# Patient Record
Sex: Female | Born: 1965 | Race: Black or African American | Hispanic: No | Marital: Single | Smoking: Current every day smoker
Health system: Southern US, Community
[De-identification: ages and names within clinical notes are randomized; demographics above are authoritative.]

## PROBLEM LIST (undated history)

## (undated) DIAGNOSIS — F32A Depression, unspecified: Secondary | ICD-10-CM

## (undated) DIAGNOSIS — E785 Hyperlipidemia, unspecified: Secondary | ICD-10-CM

## (undated) DIAGNOSIS — I639 Cerebral infarction, unspecified: Secondary | ICD-10-CM

## (undated) DIAGNOSIS — I10 Essential (primary) hypertension: Secondary | ICD-10-CM

## (undated) DIAGNOSIS — D509 Iron deficiency anemia, unspecified: Secondary | ICD-10-CM

## (undated) DIAGNOSIS — E119 Type 2 diabetes mellitus without complications: Secondary | ICD-10-CM

## (undated) DIAGNOSIS — Z720 Tobacco use: Secondary | ICD-10-CM

## (undated) DIAGNOSIS — K219 Gastro-esophageal reflux disease without esophagitis: Secondary | ICD-10-CM

---

## 2003-12-06 ENCOUNTER — Other Ambulatory Visit: Payer: Self-pay

## 2004-02-01 ENCOUNTER — Other Ambulatory Visit: Payer: Self-pay

## 2006-08-31 ENCOUNTER — Emergency Department: Payer: Self-pay | Admitting: Unknown Physician Specialty

## 2006-08-31 ENCOUNTER — Other Ambulatory Visit: Payer: Self-pay

## 2007-07-01 ENCOUNTER — Emergency Department: Payer: Self-pay | Admitting: Emergency Medicine

## 2007-07-01 ENCOUNTER — Other Ambulatory Visit: Payer: Self-pay

## 2007-09-02 ENCOUNTER — Other Ambulatory Visit: Payer: Self-pay

## 2007-09-02 ENCOUNTER — Emergency Department: Payer: Self-pay | Admitting: Emergency Medicine

## 2009-01-11 ENCOUNTER — Emergency Department: Payer: Self-pay | Admitting: Emergency Medicine

## 2010-04-06 ENCOUNTER — Emergency Department: Payer: Self-pay | Admitting: Internal Medicine

## 2010-05-04 ENCOUNTER — Emergency Department: Payer: Self-pay | Admitting: Emergency Medicine

## 2010-05-05 ENCOUNTER — Emergency Department: Payer: Self-pay | Admitting: Emergency Medicine

## 2011-06-06 ENCOUNTER — Emergency Department: Payer: Self-pay | Admitting: *Deleted

## 2012-02-16 ENCOUNTER — Emergency Department: Payer: Self-pay | Admitting: Emergency Medicine

## 2012-02-16 LAB — COMPREHENSIVE METABOLIC PANEL
Albumin: 3.8 g/dL (ref 3.4–5.0)
Alkaline Phosphatase: 77 U/L (ref 50–136)
Anion Gap: 7 (ref 7–16)
BUN: 10 mg/dL (ref 7–18)
Co2: 25 mmol/L (ref 21–32)
EGFR (Non-African Amer.): >60
Glucose: 115 mg/dL — ABNORMAL HIGH (ref 65–99)
Potassium: 3.8 mmol/L (ref 3.5–5.1)
SGOT(AST): 13 U/L — ABNORMAL LOW (ref 15–37)
SGPT (ALT): 14 U/L
Total Protein: 7.7 g/dL (ref 6.4–8.2)

## 2012-02-16 LAB — CBC
HCT: 39.1 % (ref 35.0–47.0)
HGB: 12.4 g/dL (ref 12.0–16.0)
MCV: 85 fL (ref 80–100)
Platelet: 324 10*3/uL (ref 150–440)
RBC: 4.61 10*6/uL (ref 3.80–5.20)
RDW: 15.1 % — ABNORMAL HIGH (ref 11.5–14.5)

## 2012-02-16 LAB — URINALYSIS, COMPLETE
Bacteria: NONE SEEN
Bilirubin,UR: NEGATIVE
Hyaline Cast: 3
Ph: 5 (ref 4.5–8.0)
Protein: 30
RBC,UR: 7 /HPF (ref 0–5)
WBC UR: 1 /HPF (ref 0–5)

## 2012-04-16 ENCOUNTER — Emergency Department: Payer: Self-pay | Admitting: Emergency Medicine

## 2012-04-16 LAB — COMPREHENSIVE METABOLIC PANEL
Albumin: 4.1 g/dL (ref 3.4–5.0)
Alkaline Phosphatase: 88 U/L (ref 50–136)
Anion Gap: 8 (ref 7–16)
BUN: 9 mg/dL (ref 7–18)
Calcium, Total: 10.2 mg/dL — ABNORMAL HIGH (ref 8.5–10.1)
Chloride: 105 mmol/L (ref 98–107)
Co2: 24 mmol/L (ref 21–32)
Creatinine: 0.74 mg/dL (ref 0.60–1.30)
EGFR (Non-African Amer.): >60
Glucose: 186 mg/dL — ABNORMAL HIGH (ref 65–99)
Potassium: 3.8 mmol/L (ref 3.5–5.1)
SGOT(AST): 8 U/L — ABNORMAL LOW (ref 15–37)
Sodium: 137 mmol/L (ref 136–145)
Total Protein: 7.8 g/dL (ref 6.4–8.2)

## 2012-04-16 LAB — CBC WITH DIFFERENTIAL/PLATELET
Basophil %: 0.6 %
Eosinophil #: 0.2 10*3/uL (ref 0.0–0.7)
Lymphocyte %: 28.4 %
MCH: 27.6 pg (ref 26.0–34.0)
MCHC: 33.8 g/dL (ref 32.0–36.0)
MCV: 82 fL (ref 80–100)
Neutrophil #: 5.5 10*3/uL (ref 1.4–6.5)
Neutrophil %: 63.5 %
Platelet: 305 10*3/uL (ref 150–440)
RDW: 13.9 % (ref 11.5–14.5)
WBC: 8.7 10*3/uL (ref 3.6–11.0)

## 2012-04-16 LAB — TROPONIN I: Troponin-I: <0.02 ng/mL

## 2012-10-07 ENCOUNTER — Emergency Department: Payer: Self-pay | Admitting: Emergency Medicine

## 2012-10-07 LAB — CBC
HCT: 34.9 % — ABNORMAL LOW (ref 35.0–47.0)
HGB: 11.6 g/dL — ABNORMAL LOW (ref 12.0–16.0)
MCH: 27.4 pg (ref 26.0–34.0)
MCHC: 33.3 g/dL (ref 32.0–36.0)
Platelet: 272 10*3/uL (ref 150–440)

## 2012-10-07 LAB — URINALYSIS, COMPLETE
Bacteria: NONE SEEN
WBC UR: 262 /HPF (ref 0–5)

## 2012-11-11 ENCOUNTER — Emergency Department: Payer: Self-pay | Admitting: Emergency Medicine

## 2012-11-11 LAB — WET PREP, GENITAL

## 2012-11-11 LAB — URINALYSIS, COMPLETE
Ketone: NEGATIVE
Nitrite: NEGATIVE
Ph: 5 (ref 4.5–8.0)
RBC,UR: 6 /HPF (ref 0–5)
Specific Gravity: 1.015 (ref 1.003–1.030)
Squamous Epithelial: 2

## 2012-12-03 ENCOUNTER — Emergency Department: Payer: Self-pay | Admitting: Emergency Medicine

## 2012-12-03 LAB — CBC
HCT: 36.6 % (ref 35.0–47.0)
HGB: 12.2 g/dL (ref 12.0–16.0)
MCHC: 33.4 g/dL (ref 32.0–36.0)
MCV: 85 fL (ref 80–100)
RBC: 4.32 10*6/uL (ref 3.80–5.20)
WBC: 9.9 10*3/uL (ref 3.6–11.0)

## 2012-12-03 LAB — BASIC METABOLIC PANEL
BUN: 7 mg/dL (ref 7–18)
Calcium, Total: 10 mg/dL (ref 8.5–10.1)
Chloride: 105 mmol/L (ref 98–107)
Co2: 27 mmol/L (ref 21–32)
Creatinine: 0.57 mg/dL — ABNORMAL LOW (ref 0.60–1.30)
EGFR (African American): >60
Glucose: 172 mg/dL — ABNORMAL HIGH (ref 65–99)
Osmolality: 278 (ref 275–301)

## 2012-12-03 LAB — CK TOTAL AND CKMB (NOT AT ARMC): CK, Total: 66 U/L (ref 21–215)

## 2012-12-03 LAB — TROPONIN I: Troponin-I: <0.02 ng/mL

## 2012-12-04 LAB — URINALYSIS, COMPLETE: Squamous Epithelial: NONE SEEN

## 2013-03-28 ENCOUNTER — Emergency Department: Payer: Self-pay | Admitting: Emergency Medicine

## 2013-12-15 ENCOUNTER — Emergency Department: Payer: Self-pay | Admitting: Emergency Medicine

## 2014-01-02 ENCOUNTER — Emergency Department: Payer: Self-pay | Admitting: Emergency Medicine

## 2014-01-02 LAB — COMPREHENSIVE METABOLIC PANEL
ALT: 14 U/L (ref 12–78)
Albumin: 3.8 g/dL (ref 3.4–5.0)
Alkaline Phosphatase: 59 U/L
Anion Gap: 4 — ABNORMAL LOW (ref 7–16)
BILIRUBIN TOTAL: 0.4 mg/dL (ref 0.2–1.0)
BUN: 10 mg/dL (ref 7–18)
CO2: 26 mmol/L (ref 21–32)
Calcium, Total: 9.6 mg/dL (ref 8.5–10.1)
Chloride: 108 mmol/L — ABNORMAL HIGH (ref 98–107)
Creatinine: 0.57 mg/dL — ABNORMAL LOW (ref 0.60–1.30)
EGFR (African American): >60
EGFR (Non-African Amer.): >60
Glucose: 114 mg/dL — ABNORMAL HIGH (ref 65–99)
Osmolality: 276 (ref 275–301)
Potassium: 3.8 mmol/L (ref 3.5–5.1)
SGOT(AST): 21 U/L (ref 15–37)
Sodium: 138 mmol/L (ref 136–145)
Total Protein: 7.4 g/dL (ref 6.4–8.2)

## 2014-01-02 LAB — CBC WITH DIFFERENTIAL/PLATELET
BASOS ABS: 0 10*3/uL (ref 0.0–0.1)
Basophil %: 0.3 %
EOS PCT: 2.8 %
Eosinophil #: 0.2 10*3/uL (ref 0.0–0.7)
HCT: 39.5 % (ref 35.0–47.0)
HGB: 12.5 g/dL (ref 12.0–16.0)
Lymphocyte #: 1.7 10*3/uL (ref 1.0–3.6)
Lymphocyte %: 22.6 %
MCH: 26.8 pg (ref 26.0–34.0)
MCHC: 31.7 g/dL — ABNORMAL LOW (ref 32.0–36.0)
MCV: 84 fL (ref 80–100)
MONO ABS: 0.5 x10 3/mm (ref 0.2–0.9)
Monocyte %: 6.9 %
Neutrophil #: 5 10*3/uL (ref 1.4–6.5)
Neutrophil %: 67.4 %
Platelet: 294 10*3/uL (ref 150–440)
RBC: 4.68 10*6/uL (ref 3.80–5.20)
RDW: 13.7 % (ref 11.5–14.5)
WBC: 7.4 10*3/uL (ref 3.6–11.0)

## 2014-01-02 LAB — URINALYSIS, COMPLETE
BILIRUBIN, UR: NEGATIVE
Glucose,UR: NEGATIVE mg/dL (ref 0–75)
KETONE: NEGATIVE
Leukocyte Esterase: NEGATIVE
Nitrite: NEGATIVE
PROTEIN: NEGATIVE
Ph: 5 (ref 4.5–8.0)
SPECIFIC GRAVITY: 1.016 (ref 1.003–1.030)
WBC UR: <1 /HPF (ref 0–5)

## 2014-01-02 LAB — LIPASE, BLOOD: Lipase: 89 U/L (ref 73–393)

## 2014-04-16 ENCOUNTER — Emergency Department: Payer: Self-pay | Admitting: Emergency Medicine

## 2014-04-16 LAB — CBC WITH DIFFERENTIAL/PLATELET
BASOS ABS: 0 10*3/uL (ref 0.0–0.1)
BASOS PCT: 0.5 %
Eosinophil #: 0.2 10*3/uL (ref 0.0–0.7)
Eosinophil %: 2.8 %
HCT: 37 % (ref 35.0–47.0)
HGB: 12 g/dL (ref 12.0–16.0)
LYMPHS PCT: 26.3 %
Lymphocyte #: 2.1 10*3/uL (ref 1.0–3.6)
MCH: 28.2 pg (ref 26.0–34.0)
MCHC: 32.3 g/dL (ref 32.0–36.0)
MCV: 87 fL (ref 80–100)
MONO ABS: 0.4 x10 3/mm (ref 0.2–0.9)
Monocyte %: 5.7 %
NEUTROS PCT: 64.7 %
Neutrophil #: 5.1 10*3/uL (ref 1.4–6.5)
Platelet: 258 10*3/uL (ref 150–440)
RBC: 4.24 10*6/uL (ref 3.80–5.20)
RDW: 13.7 % (ref 11.5–14.5)
WBC: 7.8 10*3/uL (ref 3.6–11.0)

## 2014-04-16 LAB — BASIC METABOLIC PANEL
Anion Gap: 5 — ABNORMAL LOW (ref 7–16)
BUN: 10 mg/dL (ref 7–18)
CALCIUM: 10.3 mg/dL — AB (ref 8.5–10.1)
Chloride: 108 mmol/L — ABNORMAL HIGH (ref 98–107)
Co2: 26 mmol/L (ref 21–32)
Creatinine: 0.85 mg/dL (ref 0.60–1.30)
EGFR (African American): >60
EGFR (Non-African Amer.): >60
GLUCOSE: 154 mg/dL — AB (ref 65–99)
Osmolality: 280 (ref 275–301)
POTASSIUM: 3.6 mmol/L (ref 3.5–5.1)
Sodium: 139 mmol/L (ref 136–145)

## 2014-04-16 LAB — URINALYSIS, COMPLETE
Bacteria: NONE SEEN
Bilirubin,UR: NEGATIVE
Glucose,UR: NEGATIVE mg/dL (ref 0–75)
Ketone: NEGATIVE
Leukocyte Esterase: NEGATIVE
NITRITE: NEGATIVE
Ph: 5 (ref 4.5–8.0)
Protein: NEGATIVE
Specific Gravity: 1.019 (ref 1.003–1.030)
WBC UR: 3 /HPF (ref 0–5)

## 2014-04-16 LAB — TROPONIN I: Troponin-I: <0.02 ng/mL

## 2014-06-24 ENCOUNTER — Emergency Department: Payer: Self-pay | Admitting: Emergency Medicine

## 2014-06-24 LAB — URINALYSIS, COMPLETE
Bilirubin,UR: NEGATIVE
GLUCOSE, UR: NEGATIVE mg/dL (ref 0–75)
KETONE: NEGATIVE
NITRITE: NEGATIVE
PROTEIN: NEGATIVE
Ph: 5 (ref 4.5–8.0)
SPECIFIC GRAVITY: 1.017 (ref 1.003–1.030)
WBC UR: 104 /HPF (ref 0–5)

## 2014-06-24 LAB — CBC WITH DIFFERENTIAL/PLATELET
BASOS ABS: 0 10*3/uL (ref 0.0–0.1)
Basophil %: 0.6 %
EOS PCT: 2.7 %
Eosinophil #: 0.2 10*3/uL (ref 0.0–0.7)
HCT: 36.2 % (ref 35.0–47.0)
HGB: 11.8 g/dL — AB (ref 12.0–16.0)
LYMPHS ABS: 1.9 10*3/uL (ref 1.0–3.6)
Lymphocyte %: 27.6 %
MCH: 27.7 pg (ref 26.0–34.0)
MCHC: 32.7 g/dL (ref 32.0–36.0)
MCV: 85 fL (ref 80–100)
MONOS PCT: 5.5 %
Monocyte #: 0.4 x10 3/mm (ref 0.2–0.9)
NEUTROS ABS: 4.4 10*3/uL (ref 1.4–6.5)
NEUTROS PCT: 63.6 %
PLATELETS: 263 10*3/uL (ref 150–440)
RBC: 4.27 10*6/uL (ref 3.80–5.20)
RDW: 13.6 % (ref 11.5–14.5)
WBC: 6.9 10*3/uL (ref 3.6–11.0)

## 2014-06-24 LAB — BASIC METABOLIC PANEL
ANION GAP: 6 — AB (ref 7–16)
BUN: 16 mg/dL (ref 7–18)
CHLORIDE: 108 mmol/L — AB (ref 98–107)
Calcium, Total: 10.6 mg/dL — ABNORMAL HIGH (ref 8.5–10.1)
Co2: 28 mmol/L (ref 21–32)
Creatinine: 0.67 mg/dL (ref 0.60–1.30)
EGFR (African American): >60
Glucose: 117 mg/dL — ABNORMAL HIGH (ref 65–99)
OSMOLALITY: 285 (ref 275–301)
POTASSIUM: 3.9 mmol/L (ref 3.5–5.1)
SODIUM: 142 mmol/L (ref 136–145)

## 2014-06-24 LAB — TROPONIN I

## 2014-11-23 ENCOUNTER — Emergency Department: Payer: Self-pay | Admitting: Emergency Medicine

## 2015-06-20 ENCOUNTER — Emergency Department
Admission: EM | Admit: 2015-06-20 | Discharge: 2015-06-21 | Disposition: A | Discharge status: Home / Self Care | Payer: Self-pay | Attending: Emergency Medicine | Admitting: Emergency Medicine

## 2015-06-20 ENCOUNTER — Emergency Department: Payer: Self-pay

## 2015-06-20 DIAGNOSIS — J40 Bronchitis, not specified as acute or chronic: Secondary | ICD-10-CM

## 2015-06-20 DIAGNOSIS — J209 Acute bronchitis, unspecified: Secondary | ICD-10-CM | POA: Insufficient documentation

## 2015-06-20 DIAGNOSIS — M546 Pain in thoracic spine: Secondary | ICD-10-CM | POA: Insufficient documentation

## 2015-06-20 DIAGNOSIS — E119 Type 2 diabetes mellitus without complications: Secondary | ICD-10-CM | POA: Insufficient documentation

## 2015-06-20 LAB — CBC WITH DIFFERENTIAL/PLATELET
BASOS PCT: 0 %
Basophils Absolute: 0 10*3/uL (ref 0–0.1)
Eosinophils Absolute: 0.3 10*3/uL (ref 0–0.7)
Eosinophils Relative: 4 %
HEMATOCRIT: 31.7 % — AB (ref 35.0–47.0)
Hemoglobin: 10.5 g/dL — ABNORMAL LOW (ref 12.0–16.0)
Lymphocytes Relative: 32 %
Lymphs Abs: 2.7 10*3/uL (ref 1.0–3.6)
MCH: 27.5 pg (ref 26.0–34.0)
MCHC: 33 g/dL (ref 32.0–36.0)
MCV: 83.3 fL (ref 80.0–100.0)
MONO ABS: 0.5 10*3/uL (ref 0.2–0.9)
MONOS PCT: 6 %
NEUTROS ABS: 4.7 10*3/uL (ref 1.4–6.5)
Neutrophils Relative %: 58 %
Platelets: 253 10*3/uL (ref 150–440)
RBC: 3.8 MIL/uL (ref 3.80–5.20)
RDW: 14.3 % (ref 11.5–14.5)
WBC: 8.2 10*3/uL (ref 3.6–11.0)

## 2015-06-20 LAB — COMPREHENSIVE METABOLIC PANEL WITH GFR
ALT: 14 U/L (ref 14–54)
AST: 15 U/L (ref 15–41)
Albumin: 3.8 g/dL (ref 3.5–5.0)
Alkaline Phosphatase: 67 U/L (ref 38–126)
Anion gap: 7 (ref 5–15)
BUN: 11 mg/dL (ref 6–20)
CO2: 27 mmol/L (ref 22–32)
Calcium: 10.8 mg/dL — ABNORMAL HIGH (ref 8.9–10.3)
Chloride: 107 mmol/L (ref 101–111)
Creatinine, Ser: 0.82 mg/dL (ref 0.44–1.00)
GFR calc Af Amer: >60 mL/min
GFR calc non Af Amer: >60 mL/min
Glucose, Bld: 118 mg/dL — ABNORMAL HIGH (ref 65–99)
Potassium: 3.3 mmol/L — ABNORMAL LOW (ref 3.5–5.1)
Sodium: 141 mmol/L (ref 135–145)
Total Bilirubin: 0.2 mg/dL — ABNORMAL LOW (ref 0.3–1.2)
Total Protein: 6.8 g/dL (ref 6.5–8.1)

## 2015-06-20 LAB — TROPONIN I

## 2015-06-20 MED ORDER — HYDROCOD POLST-CPM POLST ER 10-8 MG/5ML PO SUER
5.0000 mL | Freq: Once | ORAL | Status: AC
  Administered 2015-06-20: 5 mL via ORAL
  Filled 2015-06-20: qty 5

## 2015-06-20 MED ORDER — SODIUM CHLORIDE 0.9 % IV BOLUS (SEPSIS)
1000.0000 mL | Freq: Once | INTRAVENOUS | Status: AC
  Administered 2015-06-20: 1000 mL via INTRAVENOUS

## 2015-06-20 MED ORDER — KETOROLAC TROMETHAMINE 30 MG/ML IJ SOLN
30.0000 mg | Freq: Once | INTRAMUSCULAR | Status: AC
  Administered 2015-06-20: 30 mg via INTRAVENOUS
  Filled 2015-06-20: qty 1

## 2015-06-20 NOTE — ED Notes (Signed)
Pt states she had a cold that moved into her chest. States she thinks she had a fever. Pt states she has had a productive cough.

## 2015-06-20 NOTE — ED Provider Notes (Signed)
[facility] Emergency Department Provider Note  ____________________________________________  Time seen: Approximately 11:11 PM  I have reviewed the triage vital signs and the nursing notes.   HISTORY  Chief Complaint Chest Pain    HPI Nalia ROZANNE HEUMANN is a 49 y.o. female who presents to the ED from home with a chief complaint of cold type symptoms. Patient complains of 2-3 day history of sinus congestion, drainage which has now descended into her chest associated with cough productive of yellow sputum, chest discomfort and left upper back pain. States last time she felt this way she had pneumonia. Denies associated fever, chills, abdominal pain, nausea, vomiting, diarrhea. Denies recent travel.   Past Medical History Diabetes None for COPD  There are no active problems to display for this patient.   No past surgical history on file.  No current outpatient prescriptions on file.  Allergies Review of patient's allergies indicates no known allergies.  No family history on file.  Social History Social History  Substance Use Topics  . Smoking status: Not on file  . Smokeless tobacco: Not on file  . Alcohol Use: Not on file  Smoker  Review of Systems Constitutional: No fever/chills Eyes: No visual changes. ENT: Positive for sore throat. Cardiovascular: Positive for chest pain. Respiratory: Positive for productive cough. Denies shortness of breath. Gastrointestinal: No abdominal pain.  No nausea, no vomiting.  No diarrhea.  No constipation. Genitourinary: Negative for dysuria. Musculoskeletal: Negative for back pain. Skin: Negative for rash. Neurological: Negative for headaches, focal weakness or numbness.  10-point ROS otherwise negative.  ____________________________________________   PHYSICAL EXAM:  VITAL SIGNS: ED Triage Vitals  Enc Vitals Group     BP 06/20/15 2249 145/68 mmHg     Pulse Rate 06/20/15 2249 65     Resp 06/20/15  2249 20     Temp 06/20/15 2249 98.2 F (36.8 C)     Temp Source 06/20/15 2249 Oral     SpO2 06/20/15 2249 97 %     Weight 06/20/15 2249 154 lb (69.854 kg)     Height 06/20/15 2249  (1.702 m)     Head Cir --      Peak Flow --      Pain Score 06/20/15 2249 10     Pain Loc --      Pain Edu? --      Excl. in GC? --     Constitutional: Alert and oriented. Well appearing and in mild acute distress. Eyes: Conjunctivae are normal. PERRL. EOMI. Head: Atraumatic. Nose: Nasal congestion. Mouth/Throat: Mucous membranes are moist.  Oropharynx slightly erythematous with postnasal drip. No tonsillar exudate, swelling or peritonsillar abscess. There is no hoarse or muffled voice. There is no drooling. Neck: No stridor. No carotid bruits. Supple neck without evidence of meningismus. Hematological/Lymphatic/Immunilogical: No cervical lymphadenopathy. Cardiovascular: Normal rate, regular rhythm. Grossly normal heart sounds.  Good peripheral circulation. Anterior chest wall tender to palpation. Respiratory: Normal respiratory effort.  No retractions. Lungs with scattered rales and rhonchi. Gastrointestinal: Soft and nontender. No distention. No abdominal bruits. No CVA tenderness. Musculoskeletal: No lower extremity tenderness nor edema.  No joint effusions. Neurologic:  Normal speech and language. No gross focal neurologic deficits are appreciated. No gait instability. Skin:  Skin is warm, dry and intact. No rash noted. No petechiae. Psychiatric: Mood and affect are normal. Speech and behavior are normal.  ____________________________________________   LABS (all labs ordered are listed, but only abnormal results are displayed)  Labs Reviewed  CBC WITH DIFFERENTIAL/PLATELET - Abnormal; Notable for the following:    Hemoglobin 10.5 (*)    HCT 31.7 (*)    All other components within normal limits  COMPREHENSIVE METABOLIC PANEL - Abnormal; Notable for the following:    Potassium 3.3 (*)     Glucose, Bld 118 (*)    Calcium 10.8 (*)    Total Bilirubin 0.2 (*)    All other components within normal limits  TROPONIN I   ____________________________________________  EKG  ED ECG REPORT I, SUNG,JADE J, the attending physician, personally viewed and interpreted this ECG.   Date: 06/20/2015  EKG Time: 2252  Rate: 65  Rhythm: normal EKG, normal sinus rhythm  Axis: Normal  Intervals:none  ST&T Change: Nonspecific  ____________________________________________  RADIOLOGY  Chest 2 view (viewed by me, interpreted per Dr. Cherly Hensenhang): No acute cardiopulmonary process seen. ____________________________________________   PROCEDURES  Procedure(s) performed: None  Critical Care performed: No  ____________________________________________   INITIAL IMPRESSION / ASSESSMENT AND PLAN / ED COURSE  Pertinent labs & imaging results that were available during my care of the patient were reviewed by me and considered in my medical decision making (see chart for details).  49 year old female who presents with cold type symptoms. Scattered rhonchi and rales noted on exam. Patient is afebrile with room air saturations of 100%. Will obtain basic lab work, chest x-ray; will infuse IV fluids, Toradol, Tussionex for symptomatic relief of symptoms.  ----------------------------------------- 1:03 AM on 06/21/2015 -----------------------------------------  Patient is pain-free and resting in no acute distress. Updated her of laboratory and imaging results. Will initiate treatment with Levaquin, continue Tussionex as needed for cough and she will follow-up with her PCP early next week. Strict return precautions given. Patient verbalizes understanding and agrees with plan of care.  ____________________________________________   FINAL CLINICAL IMPRESSION(S) / ED DIAGNOSES  Final diagnoses:  Bronchitis      Irean HongJade J Sung, MD 06/21/15 [phone]

## 2015-06-20 NOTE — ED Notes (Signed)
Pt states cough, congestion, feeling hot and cold, chest pain through the center of her chest that wraps around both sides, pt states that she has never felt this way

## 2015-06-21 MED ORDER — LEVOFLOXACIN 750 MG PO TABS
750.0000 mg | ORAL_TABLET | Freq: Once | ORAL | Status: AC
  Administered 2015-06-21: 750 mg via ORAL
  Filled 2015-06-21: qty 1

## 2015-06-21 MED ORDER — LEVOFLOXACIN 750 MG PO TABS: 750.0000 mg | ORAL_TABLET | Freq: Every day | ORAL | Status: DC

## 2015-06-21 MED ORDER — HYDROCOD POLST-CPM POLST ER 10-8 MG/5ML PO SUER: 5.0000 mL | Freq: Two times a day (BID) | ORAL | Status: DC

## 2015-06-21 NOTE — Discharge Instructions (Signed)
1. Take antibiotic as prescribed (Levaquin 750 mg daily 6 days). 2. Take cough medicine as needed (Tussionex). 3. Return to the ER for worsening symptoms, persistent vomiting, difficulty breathing or other concerns.  Upper Respiratory Infection, Adult Most upper respiratory infections (URIs) are a viral infection of the air passages leading to the lungs. A URI affects the nose, throat, and upper air passages. The most common type of URI is nasopharyngitis and is typically referred to as "the common cold." URIs run their course and usually go away on their own. Most of the time, a URI does not require medical attention, but sometimes a bacterial infection in the upper airways can follow a viral infection. This is called a secondary infection. Sinus and middle ear infections are common types of secondary upper respiratory infections. Bacterial pneumonia can also complicate a URI. A URI can worsen asthma and chronic obstructive pulmonary disease (COPD). Sometimes, these complications can require emergency medical care and may be life threatening.  CAUSES Almost all URIs are caused by viruses. A virus is a type of germ and can spread from one person to another.  RISKS FACTORS You may be at risk for a URI if:   You smoke.   You have chronic heart or lung disease.  You have a weakened defense (immune) system.   You are very young or very old.   You have nasal allergies or asthma.  You work in crowded or poorly ventilated areas.  You work in health care facilities or schools. SIGNS AND SYMPTOMS  Symptoms typically develop 2-3 days after you come in contact with a cold virus. Most viral URIs last 7-10 days. However, viral URIs from the influenza virus (flu virus) can last 14-18 days and are typically more severe. Symptoms may include:   Runny or stuffy (congested) nose.   Sneezing.   Cough.   Sore throat.   Headache.   Fatigue.   Fever.   Loss of appetite.   Pain in  your forehead, behind your eyes, and over your cheekbones (sinus pain).  Muscle aches.  DIAGNOSIS  Your health care provider may diagnose a URI by:  Physical exam.  Tests to check that your symptoms are not due to another condition such as:  Strep throat.  Sinusitis.  Pneumonia.  Asthma. TREATMENT  A URI goes away on its own with time. It cannot be cured with medicines, but medicines may be prescribed or recommended to relieve symptoms. Medicines may help:  Reduce your fever.  Reduce your cough.  Relieve nasal congestion. HOME CARE INSTRUCTIONS   Take medicines only as directed by your health care provider.   Gargle warm saltwater or take cough drops to comfort your throat as directed by your health care provider.  Use a warm mist humidifier or inhale steam from a shower to increase air moisture. This may make it easier to breathe.  Drink enough fluid to keep your urine clear or pale yellow.   Eat soups and other clear broths and maintain good nutrition.   Rest as needed.   Return to work when your temperature has returned to normal or as your health care provider advises. You may need to stay home longer to avoid infecting others. You can also use a face mask and careful hand washing to prevent spread of the virus.  Increase the usage of your inhaler if you have asthma.   Do not use any tobacco products, including cigarettes, chewing tobacco, or electronic cigarettes. If you need help  quitting, ask your health care provider. PREVENTION  The best way to protect yourself from getting a cold is to practice good hygiene.   Avoid oral or hand contact with people with cold symptoms.   Wash your hands often if contact occurs.  There is no clear evidence that vitamin C, vitamin E, echinacea, or exercise reduces the chance of developing a cold. However, it is always recommended to get plenty of rest, exercise, and practice good nutrition.  SEEK MEDICAL CARE IF:    You are getting worse rather than better.   Your symptoms are not controlled by medicine.   You have chills.  You have worsening shortness of breath.  You have brown or red mucus.  You have yellow or brown nasal discharge.  You have pain in your face, especially when you bend forward.  You have a fever.  You have swollen neck glands.  You have pain while swallowing.  You have white areas in the back of your throat. SEEK IMMEDIATE MEDICAL CARE IF:   You have severe or persistent:  Headache.  Ear pain.  Sinus pain.  Chest pain.  You have chronic lung disease and any of the following:  Wheezing.  Prolonged cough.  Coughing up blood.  A change in your usual mucus.  You have a stiff neck.  You have changes in your:  Vision.  Hearing.  Thinking.  Mood. MAKE SURE YOU:   Understand these instructions.  Will watch your condition.  Will get help right away if you are not doing well or get worse.   This information is not intended to replace advice given to you by your health care provider. Make sure you discuss any questions you have with your health care provider.   Document Released: 02/14/2001 Document Revised: 01/05/2015 Document Reviewed: 11/26/2013 Elsevier Interactive Patient Education [employer]2016 Elsevier Inc.

## 2015-08-18 ENCOUNTER — Emergency Department: Payer: Self-pay

## 2015-08-18 DIAGNOSIS — Z792 Long term (current) use of antibiotics: Secondary | ICD-10-CM | POA: Insufficient documentation

## 2015-08-18 DIAGNOSIS — Z79899 Other long term (current) drug therapy: Secondary | ICD-10-CM | POA: Insufficient documentation

## 2015-08-18 DIAGNOSIS — R079 Chest pain, unspecified: Secondary | ICD-10-CM | POA: Insufficient documentation

## 2015-08-18 DIAGNOSIS — M546 Pain in thoracic spine: Secondary | ICD-10-CM | POA: Insufficient documentation

## 2015-08-18 DIAGNOSIS — F17200 Nicotine dependence, unspecified, uncomplicated: Secondary | ICD-10-CM | POA: Insufficient documentation

## 2015-08-18 DIAGNOSIS — J209 Acute bronchitis, unspecified: Secondary | ICD-10-CM | POA: Insufficient documentation

## 2015-08-18 NOTE — ED Notes (Signed)
Pt in with co chest pain since today worse when she coughs and takes a deep breath.  Has has cough for 2 weeks, pt is a smoker with hx of bronchitis.

## 2015-08-19 ENCOUNTER — Emergency Department
Admission: EM | Admit: 2015-08-19 | Discharge: 2015-08-19 | Disposition: A | Discharge status: Home / Self Care | Payer: Self-pay | Attending: Emergency Medicine | Admitting: Emergency Medicine

## 2015-08-19 DIAGNOSIS — R079 Chest pain, unspecified: Secondary | ICD-10-CM

## 2015-08-19 DIAGNOSIS — J40 Bronchitis, not specified as acute or chronic: Secondary | ICD-10-CM

## 2015-08-19 LAB — BASIC METABOLIC PANEL
Anion gap: 5 (ref 5–15)
BUN: 13 mg/dL (ref 6–20)
CHLORIDE: 107 mmol/L (ref 101–111)
CO2: 28 mmol/L (ref 22–32)
CREATININE: 0.61 mg/dL (ref 0.44–1.00)
Calcium: 11.5 mg/dL — ABNORMAL HIGH (ref 8.9–10.3)
GFR calc Af Amer: >60 mL/min (ref >60)
GFR calc non Af Amer: >60 mL/min (ref >60)
GLUCOSE: 234 mg/dL — AB (ref 65–99)
POTASSIUM: 3.6 mmol/L (ref 3.5–5.1)
SODIUM: 140 mmol/L (ref 135–145)

## 2015-08-19 LAB — TROPONIN I: Troponin I: <0.03 ng/mL (ref <0.031)

## 2015-08-19 LAB — CBC
HCT: 34.2 % — ABNORMAL LOW (ref 35.0–47.0)
HEMOGLOBIN: 11.3 g/dL — AB (ref 12.0–16.0)
MCH: 27.6 pg (ref 26.0–34.0)
MCHC: 33.1 g/dL (ref 32.0–36.0)
MCV: 83.1 fL (ref 80.0–100.0)
Platelets: 276 10*3/uL (ref 150–440)
RBC: 4.11 MIL/uL (ref 3.80–5.20)
RDW: 14.7 % — ABNORMAL HIGH (ref 11.5–14.5)
WBC: 7.9 10*3/uL (ref 3.6–11.0)

## 2015-08-19 LAB — LIPASE, BLOOD: Lipase: 26 U/L (ref 11–51)

## 2015-08-19 MED ORDER — DOXYCYCLINE HYCLATE 100 MG PO TABS: 100.0000 mg | ORAL_TABLET | Freq: Two times a day (BID) | ORAL | Status: DC

## 2015-08-19 MED ORDER — DOXYCYCLINE HYCLATE 100 MG PO TABS
100.0000 mg | ORAL_TABLET | Freq: Once | ORAL | Status: AC
  Administered 2015-08-19: 100 mg via ORAL
  Filled 2015-08-19: qty 1

## 2015-08-19 MED ORDER — CYCLOBENZAPRINE HCL 10 MG PO TABS: 10.0000 mg | ORAL_TABLET | Freq: Three times a day (TID) | ORAL | Status: DC | PRN

## 2015-08-19 NOTE — ED Provider Notes (Signed)
[facility] Emergency Department Provider Note  ____________________________________________  Time seen: Approximately 1:45 AM  I have reviewed the triage vital signs and the nursing notes.   HISTORY  Chief Complaint Chest Pain    HPI Joanna [name] is a 49 y.o. female with a history of bronchitis with presenting today with left-sided chest pain. She says that the pain has been to the left side of her chest and sharp. It has been constant since 1 PM today. However, it has been intermittent over the last 2 weeks ever since she started having a cough with a small amount of mucus production. The patient is currently an active smoker. She had a recent bout with bronchitis this past October when she received antibiotics as well as a cough syrup which relieved the symptoms. She says the last time she had symptoms like this she had pneumonia and is concerned that she may have the same this time around. She does not have any known sick contacts. Denies any shortness of breath. Says the pain is a 7 out of 10 at this time. Is worse with deep breathing as well as with movement of her arms. She says there is minimal exacerbation with coughing but she does have a job where she uses her hands and upper extremities.   No past medical history on file.  There are no active problems to display for this patient.   No past surgical history on file.  Current Outpatient Rx  Name  Route  Sig  Dispense  Refill  . chlorpheniramine-HYDROcodone (TUSSIONEX PENNKINETIC ER) 10-8 MG/5ML SUER   Oral   Take 5 mLs by mouth 2 (two) times daily.   70 mL   0   . levofloxacin (LEVAQUIN) 750 MG tablet   Oral   Take 1 tablet (750 mg total) by mouth daily.   6 tablet   0     Allergies Review of patient's allergies indicates no known allergies.  No family history on file.  Social History Social History  Substance Use Topics  . Smoking status: Not on file  . Smokeless tobacco: Not on  file  . Alcohol Use: Not on file    Review of Systems Constitutional: No fever/chills Eyes: No visual changes. ENT: No sore throat. Cardiovascular: as above Respiratory: As above Gastrointestinal: No abdominal pain.  No nausea, no vomiting.  No diarrhea.  No constipation. Genitourinary: Negative for dysuria. Musculoskeletal: Negative for back pain. Skin: Negative for rash. Neurological: Negative for headaches, focal weakness or numbness.  10-point ROS otherwise negative.  ____________________________________________   PHYSICAL EXAM:  VITAL SIGNS: ED Triage Vitals  Enc Vitals Group     BP 08/18/15 2333 151/70 mmHg     Pulse Rate 08/18/15 2333 69     Resp 08/18/15 2333 18     Temp 08/18/15 2333 97.6 F (36.4 C)     Temp Source 08/18/15 2333 Oral     SpO2 08/18/15 2333 99 %     Weight 08/18/15 2328 171 lb (77.565 kg)     Height 08/18/15 2328 5\' 7"  (1.702 m)     Head Cir --      Peak Flow --      Pain Score 08/18/15 2329 10     Pain Loc --      Pain Edu? --      Excl. in GC? --     Constitutional: Alert and oriented. Well appearing and in no acute distress. Eyes: Conjunctivae are normal. PERRL.  EOMI. Head: Atraumatic. Nose: No congestion/rhinnorhea. Mouth/Throat: Mucous membranes are moist.  Oropharynx non-erythematous. Neck: No stridor.   Cardiovascular: Normal rate, regular rhythm. Grossly normal heart sounds.  Good peripheral circulation.reproducible left-sided chest pain as well as left thoracic back pain. No crepitus. Respiratory: Normal respiratory effort.  No retractions. Lungs CTAB. Gastrointestinal: Soft and nontender. No distention. No abdominal bruits. No CVA tenderness. Musculoskeletal: No lower extremity tenderness nor edema.  No joint effusions. Neurologic:  Normal speech and language. No gross focal neurologic deficits are appreciated. No gait instability. Skin:  Skin is warm, dry and intact. No rash noted. Psychiatric: Mood and affect are normal.  Speech and behavior are normal.  ____________________________________________   LABS (all labs ordered are listed, but only abnormal results are displayed)  Labs Reviewed  CBC - Abnormal; Notable for the following:    Hemoglobin 11.3 (*)    HCT 34.2 (*)    RDW 14.7 (*)    All other components within normal limits  BASIC METABOLIC PANEL - Abnormal; Notable for the following:    Glucose, Bld 234 (*)    Calcium 11.5 (*)    All other components within normal limits  TROPONIN I  LIPASE, BLOOD   ____________________________________________  EKG  ED ECG REPORT I, Arelia Longest, the attending physician, personally viewed and interpreted this ECG.   Date: 08/19/2015  EKG Time: 2328  Rate: 65  Rhythm: normal sinus rhythm  Axis: normal  Intervals:none  ST&T Change: no ST segment elevation. No abnormal T-wave inversion.  ____________________________________________  RADIOLOGY  No acute finding on the chest x-ray. Chronic bronchitic changes. I personally reviewed these films. ____________________________________________   PROCEDURES   ____________________________________________   INITIAL IMPRESSION / ASSESSMENT AND PLAN / ED COURSE  Pertinent labs & imaging results that were available during my care of the patient were reviewed by me and considered in my medical decision making (see chart for details).  ----------------------------------------- 1:57 AM on 08/19/2015 -----------------------------------------  Patient with symptoms consistent with respiratory infection. Says she has an allergy home. Constant pain since 1 PM with reassuring EKG as well as negative troponin. Also with reproducible chest pain that worsens with movement of her upper extremities.Suspicion for cardiac as well as pulmonary embolus as an etiology. We'll give him muscle relaxants as well as antibiotic. We'll discharge patient home. Patient understands plan is one to comply.we'll give  antibiotic secondary to history of chronic bronchitis as well as smoking with likely undiagnosed COPD. However, not an acute exacerbation at this time. ____________________________________________   FINAL CLINICAL IMPRESSION(S) / ED DIAGNOSES  Chest pain. Bronchitis.    Myrna Blazer, MD 08/19/15 [phone]

## 2015-08-21 ENCOUNTER — Emergency Department: Payer: Self-pay

## 2015-08-21 ENCOUNTER — Emergency Department
Admission: EM | Admit: 2015-08-21 | Discharge: 2015-08-21 | Disposition: A | Discharge status: Home / Self Care | Payer: Self-pay | Attending: Emergency Medicine | Admitting: Emergency Medicine

## 2015-08-21 DIAGNOSIS — M25512 Pain in left shoulder: Secondary | ICD-10-CM | POA: Insufficient documentation

## 2015-08-21 DIAGNOSIS — Z79899 Other long term (current) drug therapy: Secondary | ICD-10-CM | POA: Insufficient documentation

## 2015-08-21 DIAGNOSIS — F17200 Nicotine dependence, unspecified, uncomplicated: Secondary | ICD-10-CM | POA: Insufficient documentation

## 2015-08-21 DIAGNOSIS — Z7982 Long term (current) use of aspirin: Secondary | ICD-10-CM | POA: Insufficient documentation

## 2015-08-21 DIAGNOSIS — E119 Type 2 diabetes mellitus without complications: Secondary | ICD-10-CM | POA: Insufficient documentation

## 2015-08-21 DIAGNOSIS — M546 Pain in thoracic spine: Secondary | ICD-10-CM | POA: Insufficient documentation

## 2015-08-21 DIAGNOSIS — Z7984 Long term (current) use of oral hypoglycemic drugs: Secondary | ICD-10-CM | POA: Insufficient documentation

## 2015-08-21 DIAGNOSIS — R079 Chest pain, unspecified: Secondary | ICD-10-CM | POA: Insufficient documentation

## 2015-08-21 DIAGNOSIS — Z792 Long term (current) use of antibiotics: Secondary | ICD-10-CM | POA: Insufficient documentation

## 2015-08-21 LAB — COMPREHENSIVE METABOLIC PANEL
ALK PHOS: 65 U/L (ref 38–126)
ALT: 16 U/L (ref 14–54)
AST: 13 U/L — AB (ref 15–41)
Albumin: 4.2 g/dL (ref 3.5–5.0)
Anion gap: 7 (ref 5–15)
BILIRUBIN TOTAL: 0.4 mg/dL (ref 0.3–1.2)
BUN: 13 mg/dL (ref 6–20)
CO2: 26 mmol/L (ref 22–32)
CREATININE: 0.65 mg/dL (ref 0.44–1.00)
Calcium: 11.1 mg/dL — ABNORMAL HIGH (ref 8.9–10.3)
Chloride: 108 mmol/L (ref 101–111)
GFR calc Af Amer: >60 mL/min (ref >60)
Glucose, Bld: 116 mg/dL — ABNORMAL HIGH (ref 65–99)
Potassium: 3.7 mmol/L (ref 3.5–5.1)
Sodium: 141 mmol/L (ref 135–145)
TOTAL PROTEIN: 7.3 g/dL (ref 6.5–8.1)

## 2015-08-21 LAB — CBC
HEMATOCRIT: 36.5 % (ref 35.0–47.0)
Hemoglobin: 11.7 g/dL — ABNORMAL LOW (ref 12.0–16.0)
MCH: 26.6 pg (ref 26.0–34.0)
MCHC: 32 g/dL (ref 32.0–36.0)
MCV: 83 fL (ref 80.0–100.0)
Platelets: 299 10*3/uL (ref 150–440)
RBC: 4.4 MIL/uL (ref 3.80–5.20)
RDW: 14.2 % (ref 11.5–14.5)
WBC: 6.4 10*3/uL (ref 3.6–11.0)

## 2015-08-21 LAB — URINALYSIS COMPLETE WITH MICROSCOPIC (ARMC ONLY)
BILIRUBIN URINE: NEGATIVE
Bacteria, UA: NONE SEEN
GLUCOSE, UA: NEGATIVE mg/dL
HGB URINE DIPSTICK: NEGATIVE
Ketones, ur: NEGATIVE mg/dL
LEUKOCYTES UA: NEGATIVE
NITRITE: NEGATIVE
Protein, ur: NEGATIVE mg/dL
SPECIFIC GRAVITY, URINE: 1.019 (ref 1.005–1.030)
pH: 5 (ref 5.0–8.0)

## 2015-08-21 LAB — TROPONIN I

## 2015-08-21 LAB — FIBRIN DERIVATIVES D-DIMER (ARMC ONLY): FIBRIN DERIVATIVES D-DIMER (ARMC): 333 (ref 0–499)

## 2015-08-21 MED ORDER — MORPHINE SULFATE (PF) 4 MG/ML IV SOLN
4.0000 mg | Freq: Once | INTRAVENOUS | Status: AC
  Administered 2015-08-21: 4 mg via INTRAVENOUS
  Filled 2015-08-21: qty 1

## 2015-08-21 MED ORDER — CYCLOBENZAPRINE HCL 10 MG PO TABS: 10.0000 mg | ORAL_TABLET | Freq: Three times a day (TID) | ORAL | Status: DC | PRN

## 2015-08-21 MED ORDER — ONDANSETRON HCL 4 MG/2ML IJ SOLN
4.0000 mg | Freq: Once | INTRAMUSCULAR | Status: AC
  Administered 2015-08-21: 4 mg via INTRAVENOUS
  Filled 2015-08-21: qty 2

## 2015-08-21 MED ORDER — TRAMADOL HCL 50 MG PO TABS: 50.0000 mg | ORAL_TABLET | Freq: Four times a day (QID) | ORAL | Status: DC | PRN

## 2015-08-21 NOTE — Discharge Instructions (Signed)
Back Pain, Adult °Back pain is very common in adults. The cause of back pain is rarely dangerous and the pain often gets better over time. The cause of your back pain may not be known. Some common causes of back pain include: °· Strain of the muscles or ligaments supporting the spine. °· Wear and tear (degeneration) of the spinal disks. °· Arthritis. °· Direct injury to the back. °For many people, back pain may return. Since back pain is rarely dangerous, most people can learn to manage this condition on their own. °HOME CARE INSTRUCTIONS °Watch your back pain for any changes. The following actions may help to lessen any discomfort you are feeling: °· Remain active. It is stressful on your back to sit or stand in one place for long periods of time. Do not sit, drive, or stand in one place for more than 30 minutes at a time. Take short walks on even surfaces as soon as you are able. Try to increase the length of time you walk each day. °· Exercise regularly as directed by your health care provider. Exercise helps your back heal faster. It also helps avoid future injury by keeping your muscles strong and flexible. °· Do not stay in bed. Resting more than 1-2 days can delay your recovery. °· Pay attention to your body when you bend and lift. The most comfortable positions are those that put less stress on your recovering back. Always use proper lifting techniques, including: °· Bending your knees. °· Keeping the load close to your body. °· Avoiding twisting. °· Find a comfortable position to sleep. Use a firm mattress and lie on your side with your knees slightly bent. If you lie on your back, put a pillow under your knees. °· Avoid feeling anxious or stressed. Stress increases muscle tension and can worsen back pain. It is important to recognize when you are anxious or stressed and learn ways to manage it, such as with exercise. °· Take medicines only as directed by your health care provider. Over-the-counter  medicines to reduce pain and inflammation are often the most helpful. Your health care provider may prescribe muscle relaxant drugs. These medicines help dull your pain so you can more quickly return to your normal activities and healthy exercise. °· Apply ice to the injured area: °· Put ice in a plastic bag. °· Place a towel between your skin and the bag. °· Leave the ice on for 20 minutes, 2-3 times a day for the first 2-3 days. After that, ice and heat may be alternated to reduce pain and spasms. °· Maintain a healthy weight. Excess weight puts extra stress on your back and makes it difficult to maintain good posture. °SEEK MEDICAL CARE IF: °· You have pain that is not relieved with rest or medicine. °· You have increasing pain going down into the legs or buttocks. °· You have pain that does not improve in one week. °· You have night pain. °· You lose weight. °· You have a fever or chills. °SEEK IMMEDIATE MEDICAL CARE IF:  °· You develop new bowel or bladder control problems. °· You have unusual weakness or numbness in your arms or legs. °· You develop nausea or vomiting. °· You develop abdominal pain. °· You feel faint. °  °This information is not intended to replace advice given to you by your health care provider. Make sure you discuss any questions you have with your health care provider. °  °Document Released: 08/21/2005 Document Revised: 09/11/2014 Document Reviewed: 12/23/2013 °Elsevier Interactive Patient Education ©2016 [employer]   Inc[employer]  Thoracic Strain Thoracic strain is an injury to the muscles or tendons that attach to the upper back. A strain can be mild or severe. A mild strain may take only 1-2 weeks to heal. A severe strain involves torn muscles or tendons, so it may take 6-8 weeks to heal. HOME CARE  Rest as needed. Limit your activity as told by your doctor.  If directed, put ice on the injured area:  Put ice in a plastic bag.  Place a towel between your skin and the bag.  Leave the  ice on for 20 minutes, 2-3 times per day.  Take over-the-counter and prescription medicines only as told by your doctor.  Begin doing exercises as told by your doctor or physical therapist.  Warm up before being active.  Bend your knees before you lift heavy objects.  Keep all follow-up visits as told by your doctor. This is important. GET HELP IF:  Your pain is not helped by medicine.  Your pain, bruising, or swelling is getting worse.  You have a fever. GET HELP RIGHT AWAY IF:  You have shortness of breath.  You have chest pain.  You have weakness or loss of feeling (numbness) in your legs.  You cannot control when you pee (urinate).   This information is not intended to replace advice given to you by your health care provider. Make sure you discuss any questions you have with your health care provider.   Document Released: 02/07/2008 Document Revised: 05/12/2015 Document Reviewed: 10/15/2014 Elsevier Interactive Patient Education [employer] Inc[employer]

## 2015-08-21 NOTE — ED Notes (Signed)
Pt reports seen here approx 2 days ago and dx'd with muscles spasms. Pt reports she is still having pain to her left epigastric region radiating around to her back. Pt states she is taking the medications prescribed but pain is not getting any better.

## 2015-08-21 NOTE — ED Provider Notes (Signed)
[facility] Emergency Department Provider Note  ____________________________________________  Time seen: Approximately 217 AM  I have reviewed the triage vital signs and the nursing notes.   HISTORY  Chief Complaint Abdominal Pain; Chest Pain; and Back Pain    HPI Joanna [name] is a 49 y.o. female who comes into the hospital with back pain. The patient reports that she was here Wednesday with pain in her left chest and the pain radiated into her left back. She was told that she had a strained muscle and was started on medication for the pain. She reports that the medication is not working and the pain is mainly in her back at this point. The patient reports that she's not had any pain with urination or blood in her urine. She reports that the pain is worse when she moves she does not have any pain when she takes deep breath. Her pain is a 10 out of 10 in intensity and sharp. The last time should pain like this she reports that she had pneumonia. The patient denies cough or fevers denies any abdominal pain nausea or vomiting. The patient was given doxycycline when she was here previously but has not taken any other medication for pain and discomfort.   Past medical history Diabetes  There are no active problems to display for this patient.   No past surgical history  Current Outpatient Rx  Name  Route  Sig  Dispense  Refill  . albuterol (PROVENTIL HFA;VENTOLIN HFA) 108 (90 BASE) MCG/ACT inhaler   Inhalation   Inhale 2 puffs into the lungs every 4 (four) hours as needed.         Marland Kitchen aspirin EC 81 MG tablet   Oral   Take 81 mg by mouth daily.         Marland Kitchen atorvastatin (LIPITOR) 40 MG tablet   Oral   Take 40 mg by mouth daily.         Marland Kitchen buPROPion (WELLBUTRIN SR) 150 MG 12 hr tablet   Oral   Take 150 mg by mouth daily.         . cyclobenzaprine (FLEXERIL) 10 MG tablet   Oral   Take 1 tablet (10 mg total) by mouth 3 (three) times daily as needed  for muscle spasms.   15 tablet   0   . doxycycline (VIBRA-TABS) 100 MG tablet   Oral   Take 1 tablet (100 mg total) by mouth 2 (two) times daily.   14 tablet   0   . Ferrous Gluconate 325 (36 FE) MG TABS   Oral   Take 325 mg by mouth 2 (two) times daily.         . furosemide (LASIX) 20 MG tablet   Oral   Take 20 mg by mouth daily.         Marland Kitchen glipiZIDE (GLUCOTROL XL) 10 MG 24 hr tablet   Oral   Take 10 mg by mouth daily with breakfast.         . lisinopril (PRINIVIL,ZESTRIL) 10 MG tablet   Oral   Take 10 mg by mouth daily.         . metFORMIN (GLUCOPHAGE-XR) 500 MG 24 hr tablet   Oral   Take 2,000 mg by mouth daily.         Marland Kitchen omeprazole (PRILOSEC) 40 MG capsule   Oral   Take 40 mg by mouth daily.         . chlorpheniramine-HYDROcodone (  TUSSIONEX PENNKINETIC ER) 10-8 MG/5ML SUER   Oral   Take 5 mLs by mouth 2 (two) times daily. Patient not taking: Reported on 08/21/2015   70 mL   0   . cyclobenzaprine (FLEXERIL) 10 MG tablet   Oral   Take 1 tablet (10 mg total) by mouth every 8 (eight) hours as needed for muscle spasms.   15 tablet   0   . levofloxacin (LEVAQUIN) 750 MG tablet   Oral   Take 1 tablet (750 mg total) by mouth daily. Patient not taking: Reported on 08/21/2015   6 tablet   0   . traMADol (ULTRAM) 50 MG tablet   Oral   Take 1 tablet (50 mg total) by mouth every 6 (six) hours as needed.   12 tablet   0     Allergies Review of patient's allergies indicates no known allergies.  No family history on file.  Social History Social History  Substance Use Topics  . Smoking status:  current every day smoker   . Smokeless tobacco: Not on file  . Alcohol Use:  denies alcohol use     Review of Systems Constitutional: No fever/chills Eyes: No visual changes. ENT: No sore throat. Cardiovascular: Denies chest pain. Respiratory: Denies shortness of breath. Gastrointestinal: No abdominal pain.  No nausea, no vomiting.  No diarrhea.   No constipation. Genitourinary: Negative for dysuria. Musculoskeletal:  back pain. Skin: Negative for rash. Neurological: Negative for headaches, focal weakness or numbness.  10-point ROS otherwise negative.  ____________________________________________   PHYSICAL EXAM:  VITAL SIGNS: ED Triage Vitals  Enc Vitals Group     BP 08/21/15 0047 142/117 mmHg     Pulse Rate 08/21/15 0047 65     Resp 08/21/15 0047 20     Temp 08/21/15 0047 97.7 F (36.5 C)     Temp Source 08/21/15 0047 Axillary     SpO2 08/21/15 0047 100 %     Weight 08/21/15 0047 175 lb (79.379 kg)     Height 08/21/15 0047  (1.702 m)     Head Cir --      Peak Flow --      Pain Score 08/21/15 0034 10     Pain Loc --      Pain Edu? --      Excl. in GC? --     Constitutional: Alert and oriented. Well appearing and in moderate distress. Eyes: Conjunctivae are normal. PERRL. EOMI. Head: Atraumatic. Nose: No congestion/rhinnorhea. Mouth/Throat: Mucous membranes are moist.  Oropharynx non-erythematous. Cardiovascular: Normal rate, regular rhythm. Grossly normal heart sounds.  Good peripheral circulation. Respiratory: Normal respiratory effort.  No retractions. Lungs CTAB. Gastrointestinal: Soft and nontender. No distention. Positive bowel sounds Musculoskeletal: No lower extremity tenderness nor edema.  The patient has tenderness to palpation in her back along her shoulder blades on the left. Neurologic:  Normal speech and language.  Skin:  Skin is warm, dry and intact.  Psychiatric: Mood and affect are normal.   ____________________________________________   LABS (all labs ordered are listed, but only abnormal results are displayed)  Labs Reviewed  CBC - Abnormal; Notable for the following:    Hemoglobin 11.7 (*)    All other components within normal limits  COMPREHENSIVE METABOLIC PANEL - Abnormal; Notable for the following:    Glucose, Bld 116 (*)    Calcium 11.1 (*)    AST 13 (*)    All other  components within normal limits  URINALYSIS COMPLETEWITH MICROSCOPIC ([facility] ONLY) -  Abnormal; Notable for the following:    Color, Urine YELLOW (*)    APPearance CLEAR (*)    Squamous Epithelial / LPF 0-5 (*)    All other components within normal limits  TROPONIN I  FIBRIN DERIVATIVES D-DIMER ([facility] ONLY)   ____________________________________________  EKG  ED ECG REPORT I, Rebecka ApleyWebster,  Victoriah Wilds P, the attending physician, personally viewed and interpreted this ECG.   Date: 08/21/2015  EKG Time: 0040  Rate: 65  Rhythm: normal sinus rhythm  Axis: normal  Intervals:none  ST&T Change: none  ____________________________________________  RADIOLOGY  Chest x-ray: Improved lung aeration from prior, no acute process. ____________________________________________   PROCEDURES  Procedure(s) performed: None  Critical Care performed: No  ____________________________________________   INITIAL IMPRESSION / ASSESSMENT AND PLAN / ED COURSE  Pertinent labs & imaging results that were available during my care of the patient were reviewed by me and considered in my medical decision making (see chart for details).  This is a 49 year old female who comes in with some left-sided back pain. The patient was in some significant discomfort started give her morphine 4 mg IV 1 dose of Zofran. The patient did receive a d-dimer with a concern for possible PE. Her d-dimer is unremarkable. The patient's pain was improved after the medication and I did check her urine for infection or blood which was negative. I will discharge the patient home and have her follow-up with her primary care physician. I will give the patient some medication to help with her pain and have her follow up again. ____________________________________________   FINAL CLINICAL IMPRESSION(S) / ED DIAGNOSES  Final diagnoses:  Left-sided thoracic back pain      Rebecka ApleyAllison P Porchia Sinkler, MD 08/21/15 [phone]

## 2016-05-29 ENCOUNTER — Emergency Department: Payer: Self-pay

## 2016-05-29 ENCOUNTER — Emergency Department
Admission: EM | Admit: 2016-05-29 | Discharge: 2016-05-29 | Disposition: A | Discharge status: Home / Self Care | Payer: Self-pay | Attending: Emergency Medicine | Admitting: Emergency Medicine

## 2016-05-29 ENCOUNTER — Encounter: Payer: Self-pay | Admitting: *Deleted

## 2016-05-29 DIAGNOSIS — Z791 Long term (current) use of non-steroidal anti-inflammatories (NSAID): Secondary | ICD-10-CM | POA: Insufficient documentation

## 2016-05-29 DIAGNOSIS — Z7982 Long term (current) use of aspirin: Secondary | ICD-10-CM | POA: Insufficient documentation

## 2016-05-29 DIAGNOSIS — J208 Acute bronchitis due to other specified organisms: Secondary | ICD-10-CM | POA: Insufficient documentation

## 2016-05-29 DIAGNOSIS — Z79899 Other long term (current) drug therapy: Secondary | ICD-10-CM | POA: Insufficient documentation

## 2016-05-29 DIAGNOSIS — Z7984 Long term (current) use of oral hypoglycemic drugs: Secondary | ICD-10-CM | POA: Insufficient documentation

## 2016-05-29 DIAGNOSIS — F17210 Nicotine dependence, cigarettes, uncomplicated: Secondary | ICD-10-CM | POA: Insufficient documentation

## 2016-05-29 DIAGNOSIS — Z792 Long term (current) use of antibiotics: Secondary | ICD-10-CM | POA: Insufficient documentation

## 2016-05-29 DIAGNOSIS — E119 Type 2 diabetes mellitus without complications: Secondary | ICD-10-CM | POA: Insufficient documentation

## 2016-05-29 HISTORY — DX: Type 2 diabetes mellitus without complications: E11.9

## 2016-05-29 LAB — CBC
HEMATOCRIT: 32.7 % — AB (ref 35.0–47.0)
HEMOGLOBIN: 11.3 g/dL — AB (ref 12.0–16.0)
MCH: 27.7 pg (ref 26.0–34.0)
MCHC: 34.7 g/dL (ref 32.0–36.0)
MCV: 79.9 fL — AB (ref 80.0–100.0)
Platelets: 282 10*3/uL (ref 150–440)
RBC: 4.09 MIL/uL (ref 3.80–5.20)
RDW: 13.9 % (ref 11.5–14.5)
WBC: 10.3 10*3/uL (ref 3.6–11.0)

## 2016-05-29 LAB — BASIC METABOLIC PANEL
ANION GAP: 8 (ref 5–15)
BUN: 10 mg/dL (ref 6–20)
CO2: 25 mmol/L (ref 22–32)
Calcium: 10.5 mg/dL — ABNORMAL HIGH (ref 8.9–10.3)
Chloride: 107 mmol/L (ref 101–111)
Creatinine, Ser: 0.77 mg/dL (ref 0.44–1.00)
GFR calc non Af Amer: >60 mL/min (ref >60)
Glucose, Bld: 48 mg/dL — ABNORMAL LOW (ref 65–99)
Potassium: 3.7 mmol/L (ref 3.5–5.1)
Sodium: 140 mmol/L (ref 135–145)

## 2016-05-29 LAB — INFLUENZA PANEL BY PCR (TYPE A & B)
H1N1 flu by pcr: NOT DETECTED
INFLAPCR: NEGATIVE
Influenza B By PCR: NEGATIVE

## 2016-05-29 LAB — TROPONIN I: Troponin I: <0.03 ng/mL (ref <0.03)

## 2016-05-29 MED ORDER — ALBUTEROL SULFATE HFA 108 (90 BASE) MCG/ACT IN AERS: INHALATION_SPRAY | RESPIRATORY_TRACT | 1 refills | Status: DC

## 2016-05-29 MED ORDER — BENZONATATE 100 MG PO CAPS: 100.0000 mg | ORAL_CAPSULE | Freq: Three times a day (TID) | ORAL | 0 refills | Status: DC | PRN

## 2016-05-29 MED ORDER — HYDROCODONE-ACETAMINOPHEN 5-325 MG PO TABS
2.0000 | ORAL_TABLET | Freq: Once | ORAL | Status: AC
  Administered 2016-05-29: 2 via ORAL
  Filled 2016-05-29: qty 2

## 2016-05-29 MED ORDER — HYDROCODONE-ACETAMINOPHEN 5-325 MG PO TABS: ORAL_TABLET | ORAL | 0 refills | Status: DC

## 2016-05-29 NOTE — ED Provider Notes (Signed)
[facility] Emergency Department Provider Note  ____________________________________________   First MD Initiated Contact with Patient 05/29/16 1549     (approximate)  I have reviewed the triage vital signs and the nursing notes.   HISTORY  Chief Complaint Chest Pain    HPI Joanna [name] is a 50 y.o. female with a medical history as described below and who has a chronic tobacco user presents with a gradual worsening of upper respiratory symptoms over the last 4-5 days that includes nasal congestion and drainage, pressure in her face, frequent productive cough, and general malaise.  She is also having some chest pain in the anterior chest wall that is worse when she coughs and that has started over the last couple of days.  She states that they symptoms are severe and nothing is making them better or worse.  She denies nausea, vomiting, shortness of breath, wheezing, abdominal pain, dysuria.  She has had some subjective fever but is afebrile in the emergency department.   Past Medical History:  Diagnosis Date  . Diabetes mellitus without complication (HCC)     There are no active problems to display for this patient.   History reviewed. No pertinent surgical history.  Prior to Admission medications   Medication Sig Start Date End Date Taking? Authorizing Provider  albuterol (PROVENTIL HFA;VENTOLIN HFA) 108 (90 Base) MCG/ACT inhaler Inhale 2-4 puffs by mouth every 4 hours as needed for wheezing, cough, and/or shortness of breath 05/29/16   Loleta Rose, MD  aspirin EC 81 MG tablet Take 81 mg by mouth daily. 12/29/14   Historical Provider, MD  atorvastatin (LIPITOR) 40 MG tablet Take 40 mg by mouth daily. 01/20/15 01/20/16  Historical Provider, MD  benzonatate (TESSALON PERLES) 100 MG capsule Take 1 capsule (100 mg total) by mouth 3 (three) times daily as needed for cough. 05/29/16   Loleta Rose, MD  buPROPion (WELLBUTRIN SR) 150 MG 12 hr tablet Take 150 mg  by mouth daily. 01/20/15   Historical Provider, MD  chlorpheniramine-HYDROcodone (TUSSIONEX PENNKINETIC ER) 10-8 MG/5ML SUER Take 5 mLs by mouth 2 (two) times daily. Patient not taking: Reported on 08/21/2015 06/21/15   Irean Hong, MD  cyclobenzaprine (FLEXERIL) 10 MG tablet Take 1 tablet (10 mg total) by mouth 3 (three) times daily as needed for muscle spasms. 08/19/15   Myrna Blazer, MD  cyclobenzaprine (FLEXERIL) 10 MG tablet Take 1 tablet (10 mg total) by mouth every 8 (eight) hours as needed for muscle spasms. 08/21/15   Rebecka Apley, MD  doxycycline (VIBRA-TABS) 100 MG tablet Take 1 tablet (100 mg total) by mouth 2 (two) times daily. 08/19/15   Myrna Blazer, MD  Ferrous Gluconate 325 (36 FE) MG TABS Take 325 mg by mouth 2 (two) times daily. 12/29/14   Historical Provider, MD  furosemide (LASIX) 20 MG tablet Take 20 mg by mouth daily. 04/27/15   Historical Provider, MD  glipiZIDE (GLUCOTROL XL) 10 MG 24 hr tablet Take 10 mg by mouth daily with breakfast.    Historical Provider, MD  HYDROcodone-acetaminophen (NORCO/VICODIN) 5-325 MG tablet Take 1-2 tablets by mouth every 4 hours as needed for moderate to severe pain and/or cough. 05/29/16   Loleta Rose, MD  levofloxacin (LEVAQUIN) 750 MG tablet Take 1 tablet (750 mg total) by mouth daily. Patient not taking: Reported on 08/21/2015 06/21/15   Irean Hong, MD  lisinopril (PRINIVIL,ZESTRIL) 10 MG tablet Take 10 mg by mouth daily. 07/12/15   Historical Provider, MD  metFORMIN (GLUCOPHAGE-XR) 500 MG 24 hr tablet Take 2,000 mg by mouth daily. 04/27/15   Historical Provider, MD  omeprazole (PRILOSEC) 40 MG capsule Take 40 mg by mouth daily. 01/20/15 01/20/16  Historical Provider, MD  traMADol (ULTRAM) 50 MG tablet Take 1 tablet (50 mg total) by mouth every 6 (six) hours as needed. 08/21/15   Rebecka Apley, MD    Allergies Review of patient's allergies indicates no known allergies.  No family history on file.  Social  History Social History  Substance Use Topics  . Smoking status: Current Every Day Smoker    Packs/day: 1.00    Types: Cigarettes  . Smokeless tobacco: Not on file  . Alcohol use No    Review of Systems Constitutional: Subjective fever and general malaise Eyes: No visual changes. ENT: Mild sore throat but no difficulty swallowing.  Nasal congestion and runny nose. Cardiovascular: Anterior chest wall pain  Respiratory: Denies shortness of breath but having frequent productive cough Gastrointestinal: No abdominal pain.  No nausea, no vomiting.  No diarrhea.  No constipation. Genitourinary: Negative for dysuria. Musculoskeletal: Negative for back pain. Skin: Negative for rash. Neurological: Negative for headaches, focal weakness or numbness.  10-point ROS otherwise negative.  ____________________________________________   PHYSICAL EXAM:  VITAL SIGNS: ED Triage Vitals  Enc Vitals Group     BP 05/29/16 1456 (!) 146/90     Pulse Rate 05/29/16 1456 80     Resp 05/29/16 1456 20     Temp 05/29/16 1456 98.6 F (37 C)     Temp Source 05/29/16 1456 Oral     SpO2 05/29/16 1456 97 %     Weight 05/29/16 1456 161 lb (73 kg)     Height 05/29/16 1456 5\' 7"  (1.702 m)     Head Circumference --      Peak Flow --      Pain Score 05/29/16 1523 8     Pain Loc --      Pain Edu? --      Excl. in GC? --     Constitutional: Alert and oriented. No acute distress but does appear uncomfortable Eyes: Conjunctivae are normal. PERRL. EOMI. Head: Atraumatic. Nose: +congestion/rhinnorhea. Mouth/Throat: Mucous membranes are moist.  Oropharynx mildly erythematous and the back without any evidence of acute bacterial infection nor abscess Neck: No stridor.  No meningeal signs.   Cardiovascular: Normal rate, regular rhythm. Good peripheral circulation. Grossly normal heart sounds. Respiratory: Normal respiratory effort.  No retractions. Lungs CTAB.  Highly reproducible anterior chest wall tenderness to  palpation Gastrointestinal: Soft and nontender. No distention.  Musculoskeletal: No lower extremity tenderness nor edema. No gross deformities of extremities. Neurologic:  Normal speech and language. No gross focal neurologic deficits are appreciated.  Skin:  Skin is warm, dry and intact. No rash noted. Psychiatric: Mood and affect are normal. Speech and behavior are normal.  ____________________________________________   LABS (all labs ordered are listed, but only abnormal results are displayed)  Labs Reviewed  BASIC METABOLIC PANEL - Abnormal; Notable for the following:       Result Value   Glucose, Bld 48 (*)    Calcium 10.5 (*)    All other components within normal limits  CBC - Abnormal; Notable for the following:    Hemoglobin 11.3 (*)    HCT 32.7 (*)    MCV 79.9 (*)    All other components within normal limits  TROPONIN I  INFLUENZA PANEL BY PCR (TYPE A & B, H1N1)  ____________________________________________  EKG  ED ECG REPORT I, Ranny Wiebelhaus, the attending physician, personally viewed and interpreted this ECG.  Date: 05/29/2016 EKG Time: 14:56 Rate: 82 Rhythm: normal sinus rhythm QRS Axis: normal Intervals: normal ST/T Wave abnormalities: normal Conduction Disturbances: none Narrative Interpretation: unremarkable  ____________________________________________  RADIOLOGY   Dg Chest 2 View  Result Date: 05/29/2016 CLINICAL DATA:  Central chest pain. Cough and congestion for 4 days. Shortness of breath. EXAM: CHEST  2 VIEW COMPARISON:  Two-view chest x-ray 08/21/2015 FINDINGS: The heart size is normal. Mild airway thickening is present without focal airspace disease. There is no edema or effusion. The visualized soft tissues and bony thorax are unremarkable. IMPRESSION: 1. Airway thickening suggesting reactive airways disease or bronchitis without focal airspace disease. Electronically Signed   By: Marin Robertshristopher  Mattern M.D.   On: 05/29/2016 15:56     ____________________________________________   PROCEDURES  Procedure(s) performed:   Procedures   Critical Care performed: No ____________________________________________   INITIAL IMPRESSION / ASSESSMENT AND PLAN / ED COURSE  Pertinent labs & imaging results that were available during my care of the patient were reviewed by me and considered in my medical decision making (see chart for details).  No wheezing with good air movement, no indication for treatment of COPD exacerbation (including no diagnosis of COPD).  I believe the patient is suffering from an acute viral bronchitis.  It is possible she may have influenza although it is early in the season and she is outside the window of treatment for Tamiflu, and given the questionable (at best) efficacy of Tamiflu, I would not prescribe it anyway for outpatient treatment.  No PNA on CXR.  Labs unremarkable.  VSS.  I counseled her regarding symptomatic treatment I will give her a few pills of Norco which should help both with the anterior chest wall pain as well as an albuterol inhaler if she develops wheezing and some Tessalon to use during the day.  She has a HEART score of 2 (low risk) and is PERC negative.    I gave my usual and customary return precautions.      ____________________________________________  FINAL CLINICAL IMPRESSION(S) / ED DIAGNOSES  Final diagnoses:  Acute viral bronchitis     MEDICATIONS GIVEN DURING THIS VISIT:  Medications  HYDROcodone-acetaminophen (NORCO/VICODIN) 5-325 MG per tablet 2 tablet (not administered)     NEW OUTPATIENT MEDICATIONS STARTED DURING THIS VISIT:  New Prescriptions   ALBUTEROL (PROVENTIL HFA;VENTOLIN HFA) 108 (90 BASE) MCG/ACT INHALER    Inhale 2-4 puffs by mouth every 4 hours as needed for wheezing, cough, and/or shortness of breath   BENZONATATE (TESSALON PERLES) 100 MG CAPSULE    Take 1 capsule (100 mg total) by mouth 3 (three) times daily as needed for cough.    HYDROCODONE-ACETAMINOPHEN (NORCO/VICODIN) 5-325 MG TABLET    Take 1-2 tablets by mouth every 4 hours as needed for moderate to severe pain and/or cough.    Modified Medications   No medications on file    Discontinued Medications   ALBUTEROL (PROVENTIL HFA;VENTOLIN HFA) 108 (90 BASE) MCG/ACT INHALER    Inhale 2 puffs into the lungs every 4 (four) hours as needed.     Note:  This document was prepared using Dragon voice recognition software and may include unintentional dictation errors.    Loleta Roseory Jakeel Starliper, MD 05/29/16 [phone]

## 2016-05-29 NOTE — ED Triage Notes (Signed)
Pt complains of central chest pain, cough, congestion for 4 days, pt complains of shortness of breath

## 2016-05-29 NOTE — ED Notes (Signed)
Pt refused to let me take her vitals. She stated "I have been waiting forever to get d/c and my medicine and I am ready to go"

## 2016-05-29 NOTE — Discharge Instructions (Signed)

## 2016-05-31 ENCOUNTER — Encounter: Payer: Self-pay | Admitting: Emergency Medicine

## 2016-05-31 ENCOUNTER — Emergency Department
Admission: EM | Admit: 2016-05-31 | Discharge: 2016-05-31 | Disposition: A | Discharge status: Home / Self Care | Payer: Self-pay | Attending: Emergency Medicine | Admitting: Emergency Medicine

## 2016-05-31 DIAGNOSIS — Z791 Long term (current) use of non-steroidal anti-inflammatories (NSAID): Secondary | ICD-10-CM | POA: Insufficient documentation

## 2016-05-31 DIAGNOSIS — Z792 Long term (current) use of antibiotics: Secondary | ICD-10-CM | POA: Insufficient documentation

## 2016-05-31 DIAGNOSIS — Z7984 Long term (current) use of oral hypoglycemic drugs: Secondary | ICD-10-CM | POA: Insufficient documentation

## 2016-05-31 DIAGNOSIS — J069 Acute upper respiratory infection, unspecified: Secondary | ICD-10-CM | POA: Insufficient documentation

## 2016-05-31 DIAGNOSIS — E119 Type 2 diabetes mellitus without complications: Secondary | ICD-10-CM | POA: Insufficient documentation

## 2016-05-31 DIAGNOSIS — Z79899 Other long term (current) drug therapy: Secondary | ICD-10-CM | POA: Insufficient documentation

## 2016-05-31 DIAGNOSIS — R0981 Nasal congestion: Secondary | ICD-10-CM

## 2016-05-31 DIAGNOSIS — Z7982 Long term (current) use of aspirin: Secondary | ICD-10-CM | POA: Insufficient documentation

## 2016-05-31 DIAGNOSIS — F17210 Nicotine dependence, cigarettes, uncomplicated: Secondary | ICD-10-CM | POA: Insufficient documentation

## 2016-05-31 MED ORDER — BENZONATATE 100 MG PO CAPS: 100.0000 mg | ORAL_CAPSULE | Freq: Four times a day (QID) | ORAL | 0 refills | Status: DC | PRN

## 2016-05-31 MED ORDER — OXYMETAZOLINE HCL 0.05 % NA SOLN: 2.0000 | Freq: Two times a day (BID) | NASAL | 0 refills | Status: AC

## 2016-05-31 MED ORDER — IPRATROPIUM-ALBUTEROL 0.5-2.5 (3) MG/3ML IN SOLN
3.0000 mL | Freq: Once | RESPIRATORY_TRACT | Status: AC
  Administered 2016-05-31: 3 mL via RESPIRATORY_TRACT
  Filled 2016-05-31: qty 3

## 2016-05-31 MED ORDER — OXYMETAZOLINE HCL 0.05 % NA SOLN
1.0000 | Freq: Once | NASAL | Status: AC
  Administered 2016-05-31: 1 via NASAL
  Filled 2016-05-31: qty 15

## 2016-05-31 MED ORDER — PREDNISONE 20 MG PO TABS
60.0000 mg | ORAL_TABLET | Freq: Once | ORAL | Status: AC
  Administered 2016-05-31: 60 mg via ORAL
  Filled 2016-05-31: qty 3

## 2016-05-31 MED ORDER — PREDNISONE 20 MG PO TABS: 60.0000 mg | ORAL_TABLET | Freq: Every day | ORAL | 0 refills | Status: DC

## 2016-05-31 MED ORDER — KETOROLAC TROMETHAMINE 60 MG/2ML IM SOLN
60.0000 mg | Freq: Once | INTRAMUSCULAR | Status: AC
  Administered 2016-05-31: 60 mg via INTRAMUSCULAR
  Filled 2016-05-31: qty 2

## 2016-05-31 MED ORDER — BENZONATATE 100 MG PO CAPS
100.0000 mg | ORAL_CAPSULE | Freq: Once | ORAL | Status: AC
  Administered 2016-05-31: 100 mg via ORAL
  Filled 2016-05-31: qty 1

## 2016-05-31 NOTE — ED Notes (Addendum)
Preceding notes entered and performed by Shirlee MoreL Caryl Fate RN; entered erroneously under Dirk DressS. McLendon EDT name

## 2016-05-31 NOTE — ED Provider Notes (Signed)
[facility] Emergency Department Provider Note   ____________________________________________   First MD Initiated Contact with Patient 05/31/16 [phone]     (approximate)  I have reviewed the triage vital signs and the nursing notes.   HISTORY  Chief Complaint Cough and Nasal Congestion    HPI Joanna [name] is a 50 y.o. female who comes into the hospital today with some pain in her chest from coughing and pain in her face. She reports that she has been coughing. She reports that she uses the bathroom on herself sometimes after coughing a lot. She was here yesterday and given some lozenges and pain medicine. She reports that she's had some hot and cold flashes. The patient is been taking some Alka-Seltzer plus and having a productive cough with thick white phlegm. She denies any sick contacts or fevers. She took some Excedrin for her migraine but reports her headache has been coming and going. She's had the symptoms for the past 4 days. The patient is here for evaluation and to help her symptoms.   Past Medical History:  Diagnosis Date  . Diabetes mellitus without complication (HCC)     There are no active problems to display for this patient.   History reviewed. No pertinent surgical history.  Prior to Admission medications   Medication Sig Start Date End Date Taking? Authorizing Provider  albuterol (PROVENTIL HFA;VENTOLIN HFA) 108 (90 Base) MCG/ACT inhaler Inhale 2-4 puffs by mouth every 4 hours as needed for wheezing, cough, and/or shortness of breath 05/29/16   Loleta Roseory Forbach, MD  aspirin EC 81 MG tablet Take 81 mg by mouth daily. 12/29/14   Historical Provider, MD  atorvastatin (LIPITOR) 40 MG tablet Take 40 mg by mouth daily. 01/20/15 01/20/16  Historical Provider, MD  benzonatate (TESSALON PERLES) 100 MG capsule Take 1 capsule (100 mg total) by mouth every 6 (six) hours as needed for cough. 05/31/16   Rebecka ApleyAllison P Maryem Shuffler, MD  buPROPion [facility](WELLBUTRIN SR) 150  MG 12 hr tablet Take 150 mg by mouth daily. 01/20/15   Historical Provider, MD  chlorpheniramine-HYDROcodone (TUSSIONEX PENNKINETIC ER) 10-8 MG/5ML SUER Take 5 mLs by mouth 2 (two) times daily. Patient not taking: Reported on 08/21/2015 06/21/15   Irean HongJade J Sung, MD  cyclobenzaprine (FLEXERIL) 10 MG tablet Take 1 tablet (10 mg total) by mouth 3 (three) times daily as needed for muscle spasms. 08/19/15   Myrna Blazeravid Matthew Schaevitz, MD  cyclobenzaprine (FLEXERIL) 10 MG tablet Take 1 tablet (10 mg total) by mouth every 8 (eight) hours as needed for muscle spasms. 08/21/15   Rebecka ApleyAllison P Alanea Woolridge, MD  doxycycline (VIBRA-TABS) 100 MG tablet Take 1 tablet (100 mg total) by mouth 2 (two) times daily. 08/19/15   Myrna Blazeravid Matthew Schaevitz, MD  Ferrous Gluconate 325 (36 FE) MG TABS Take 325 mg by mouth 2 (two) times daily. 12/29/14   Historical Provider, MD  furosemide (LASIX) 20 MG tablet Take 20 mg by mouth daily. 04/27/15   Historical Provider, MD  glipiZIDE (GLUCOTROL XL) 10 MG 24 hr tablet Take 10 mg by mouth daily with breakfast.    Historical Provider, MD  HYDROcodone-acetaminophen (NORCO/VICODIN) 5-325 MG tablet Take 1-2 tablets by mouth every 4 hours as needed for moderate to severe pain and/or cough. 05/29/16   Loleta Roseory Forbach, MD  levofloxacin (LEVAQUIN) 750 MG tablet Take 1 tablet (750 mg total) by mouth daily. Patient not taking: Reported on 08/21/2015 06/21/15   Irean HongJade J Sung, MD  lisinopril (PRINIVIL,ZESTRIL) 10 MG tablet Take 10  mg by mouth daily. 07/12/15   Historical Provider, MD  metFORMIN (GLUCOPHAGE-XR) 500 MG 24 hr tablet Take 2,000 mg by mouth daily. 04/27/15   Historical Provider, MD  omeprazole (PRILOSEC) 40 MG capsule Take 40 mg by mouth daily. 01/20/15 01/20/16  Historical Provider, MD  oxymetazoline (AFRIN) 0.05 % nasal spray Place 2 sprays into both nostrils 2 (two) times daily. 05/31/16 06/03/16  Rebecka Apley, MD  predniSONE (DELTASONE) 20 MG tablet Take 3 tablets (60 mg total) by mouth daily. 05/31/16    Rebecka Apley, MD  traMADol (ULTRAM) 50 MG tablet Take 1 tablet (50 mg total) by mouth every 6 (six) hours as needed. 08/21/15   Rebecka Apley, MD    Allergies Review of patient's allergies indicates no known allergies.  No family history on file.  Social History Social History  Substance Use Topics  . Smoking status: Current Every Day Smoker    Packs/day: 1.00    Types: Cigarettes  . Smokeless tobacco: Never Used  . Alcohol use No    Review of Systems Constitutional: No fever/chills Eyes: No visual changes. ENT: Nasal congestion Cardiovascular: chest pain. Respiratory: Cough and shortness of breath. Gastrointestinal: No abdominal pain.  No nausea, no vomiting.  No diarrhea.  No constipation. Genitourinary: Negative for dysuria. Musculoskeletal: Negative for back pain. Skin: Negative for rash. Neurological: Headache  10-point ROS otherwise negative.  ____________________________________________   PHYSICAL EXAM:  VITAL SIGNS: ED Triage Vitals  Enc Vitals Group     BP 05/31/16 0551 (!) 151/86     Pulse Rate 05/31/16 0551 78     Resp 05/31/16 0551 18     Temp --      Temp src --      SpO2 05/31/16 0551 99 %     Weight 05/31/16 0225 160 lb (72.6 kg)     Height 05/31/16 0225 5\' 7"  (1.702 m)     Head Circumference --      Peak Flow --      Pain Score 05/31/16 0225 10     Pain Loc --      Pain Edu? --      Excl. in GC? --     Constitutional: Alert and oriented. Well appearing and in Moderate distress. Eyes: Conjunctivae are normal. PERRL. EOMI. Head: Atraumatic. Nose: congestion/rhinnorhea. Mouth/Throat: Mucous membranes are moist.  Oropharynx non-erythematous. Cardiovascular: Normal rate, regular rhythm. Grossly normal heart sounds.  Good peripheral circulation. Respiratory: Normal respiratory effort.  No retractions. Lungs CTAB. Cough with some mild prolonged expiration phase Gastrointestinal: Soft and nontender. No distention. Positive bowel  sounds Musculoskeletal: No lower extremity tenderness nor edema. Neurologic:  Normal speech and language.  Skin:  Skin is warm, dry and intact.  Psychiatric: Mood and affect are normal.   ____________________________________________   LABS (all labs ordered are listed, but only abnormal results are displayed)  Labs Reviewed - No data to display ____________________________________________  EKG  none ____________________________________________  RADIOLOGY  none ____________________________________________   PROCEDURES  Procedure(s) performed: None  Procedures  Critical Care performed: No  ____________________________________________   INITIAL IMPRESSION / ASSESSMENT AND PLAN / ED COURSE  Pertinent labs & imaging results that were available during my care of the patient were reviewed by me and considered in my medical decision making (see chart for details).  This is a 50 year old female who comes into the hospital today with some nasal congestion and chest pain with coughing. The patient reports that she was seen today but she still  having symptoms. I will give the patient a DuoNeb treatment as well as some Afrin for the nasal congestion. I will give the patient a benzonatate as well. The patient had a chest x-ray earlier which did not show any pneumonia. Once the patient received some medications I will reassess the patient.  Clinical Course    Looking back at her notes the patient also received albuterol for shortness of breath. It doesn't appear that she's been using it. I will discharge the patient with some benzonatate as well as some prednisone. The patient should follow-up with the acute care clinic. She'll be discharged to home. ____________________________________________   FINAL CLINICAL IMPRESSION(S) / ED DIAGNOSES  Final diagnoses:  URI (upper respiratory infection)  Nasal congestion      NEW MEDICATIONS STARTED DURING THIS VISIT:  Discharge  Medication List as of 05/31/2016  6:01 AM    START taking these medications   Details  oxymetazoline (AFRIN) 0.05 % nasal spray Place 2 sprays into both nostrils 2 (two) times daily., Starting Wed 05/31/2016, Until Sat 06/03/2016, Print    predniSONE (DELTASONE) 20 MG tablet Take 3 tablets (60 mg total) by mouth daily., Starting Wed 05/31/2016, Print         Note:  This document was prepared using Dragon voice recognition software and may include unintentional dictation errors.    Rebecka Apley, MD 05/31/16 [phone]

## 2016-05-31 NOTE — ED Triage Notes (Addendum)
Pt ambulatory to triage without difficulty or distress noted; st here yesterday and dx with bronchitis; rx inhaler, throat lozenge and pain med but c/o persistent productive cough green sputum esp when lying down, sinus congestion and pressure; pt smells heavily of cigarette smoke

## 2017-04-10 ENCOUNTER — Emergency Department: Payer: Self-pay

## 2017-04-10 ENCOUNTER — Encounter: Payer: Self-pay | Admitting: Emergency Medicine

## 2017-04-10 ENCOUNTER — Emergency Department
Admission: EM | Admit: 2017-04-10 | Discharge: 2017-04-10 | Disposition: A | Discharge status: Home / Self Care | Payer: Self-pay | Attending: Emergency Medicine | Admitting: Emergency Medicine

## 2017-04-10 DIAGNOSIS — E119 Type 2 diabetes mellitus without complications: Secondary | ICD-10-CM | POA: Insufficient documentation

## 2017-04-10 DIAGNOSIS — M79602 Pain in left arm: Secondary | ICD-10-CM | POA: Insufficient documentation

## 2017-04-10 DIAGNOSIS — F17210 Nicotine dependence, cigarettes, uncomplicated: Secondary | ICD-10-CM | POA: Insufficient documentation

## 2017-04-10 LAB — CBC
HEMATOCRIT: 33.3 % — AB (ref 35.0–47.0)
HEMOGLOBIN: 11.4 g/dL — AB (ref 12.0–16.0)
MCH: 27.3 pg (ref 26.0–34.0)
MCHC: 34.2 g/dL (ref 32.0–36.0)
MCV: 79.9 fL — AB (ref 80.0–100.0)
Platelets: 272 10*3/uL (ref 150–440)
RBC: 4.17 MIL/uL (ref 3.80–5.20)
RDW: 14.7 % — ABNORMAL HIGH (ref 11.5–14.5)
WBC: 7.1 10*3/uL (ref 3.6–11.0)

## 2017-04-10 LAB — BASIC METABOLIC PANEL
ANION GAP: 8 (ref 5–15)
BUN: 15 mg/dL (ref 6–20)
CHLORIDE: 107 mmol/L (ref 101–111)
CO2: 24 mmol/L (ref 22–32)
Calcium: 10.9 mg/dL — ABNORMAL HIGH (ref 8.9–10.3)
Creatinine, Ser: 0.74 mg/dL (ref 0.44–1.00)
GFR calc Af Amer: >60 mL/min (ref >60)
GLUCOSE: 246 mg/dL — AB (ref 65–99)
POTASSIUM: 3.9 mmol/L (ref 3.5–5.1)
Sodium: 139 mmol/L (ref 135–145)

## 2017-04-10 LAB — TROPONIN I: Troponin I: <0.03 ng/mL (ref <0.03)

## 2017-04-10 MED ORDER — IBUPROFEN 800 MG PO TABS: 800.0000 mg | ORAL_TABLET | Freq: Three times a day (TID) | ORAL | 0 refills | Status: DC | PRN

## 2017-04-10 MED ORDER — DIAZEPAM 5 MG PO TABS: 5.0000 mg | ORAL_TABLET | Freq: Three times a day (TID) | ORAL | 0 refills | Status: DC | PRN

## 2017-04-10 MED ORDER — OXYCODONE-ACETAMINOPHEN 5-325 MG PO TABS
2.0000 | ORAL_TABLET | Freq: Once | ORAL | Status: DC
  Filled 2017-04-10: qty 2

## 2017-04-10 NOTE — ED Provider Notes (Signed)
[facility] Emergency Department Provider Note       Time seen: ----------------------------------------- 9:32 AM on 04/10/2017 -----------------------------------------     I have reviewed the triage vital signs and the nursing notes.   HISTORY   Chief Complaint Chest Pain    HPI Joanna [name] is a 51 y.o. female who presents to the ED for left arm pain for the last 2 weeks. She does not have significant chest pain, main complaint is pain in the left arm. Patient works at a [employer] and does repetitive movements there. She describes pain 10 out of 10 in the left arm. It is worse when she is doing her work activity. She denies any specific trauma or injury.   Past Medical History:  Diagnosis Date  . Diabetes mellitus without complication (HCC)     There are no active problems to display for this patient.   History reviewed. No pertinent surgical history.  Allergies Patient has no known allergies.  Social History Social History  Substance Use Topics  . Smoking status: Current Every Day Smoker    Packs/day: 1.00    Types: Cigarettes  . Smokeless tobacco: Never Used  . Alcohol use No    Review of Systems Constitutional: Negative for fever. Eyes: Negative for vision changes ENT:  Negative for congestion, sore throat Cardiovascular: Negative for chest pain. Respiratory: Negative for shortness of breath. Gastrointestinal: Negative for abdominal pain, vomiting and diarrhea. Genitourinary: Negative for dysuria. Musculoskeletal: Positive for left arm pain Skin: Negative for rash. Neurological: Negative for headaches, focal weakness or numbness.  All systems negative/normal/unremarkable except as stated in the HPI  ____________________________________________   PHYSICAL EXAM:  VITAL SIGNS: ED Triage Vitals [04/10/17 0851]  Enc Vitals Group     BP 139/66     Pulse Rate 70     Resp 18     Temp 98.2 F (36.8 C)     Temp Source  Oral     SpO2 98 %     Weight 157 lb (71.2 kg)     Height 5\' 7"  (1.702 m)     Head Circumference      Peak Flow      Pain Score 10     Pain Loc      Pain Edu?      Excl. in GC?     Constitutional: Alert and oriented. Well appearing and in no distress. Eyes: Conjunctivae are normal. Normal extraocular movements. ENT   Head: Normocephalic and atraumatic.   Nose: No congestion/rhinnorhea.   Mouth/Throat: Mucous membranes are moist.   Neck: No stridor. Cardiovascular: Normal rate, regular rhythm. No murmurs, rubs, or gallops. Respiratory: Normal respiratory effort without tachypnea nor retractions. Breath sounds are clear and equal bilaterally. No wheezes/rales/rhonchi. Gastrointestinal: Soft and nontender. Normal bowel sounds Musculoskeletal:  Pain with range of motion of the left arm, particularly the left forearm and elbow Neurologic:  Normal speech and language. No gross focal neurologic deficits are appreciated.  Skin:  Skin is warm, dry and intact. No rash noted. Psychiatric: Mood and affect are normal. Speech and behavior are normal.  ____________________________________________  EKG: Interpreted by me. Sinus rhythm with a rate of 60 bpm, normal PR interval, normal QRS, normal QT.  ____________________________________________  ED COURSE:  Pertinent labs & imaging results that were available during my care of the patient were reviewed by me and considered in my medical decision making (see chart for details). Patient presents for left arm pain, we will assess  with labs and imaging as indicated.   Procedures ____________________________________________   LABS (pertinent positives/negatives)  Labs Reviewed  BASIC METABOLIC PANEL - Abnormal; Notable for the following:       Result Value   Glucose, Bld 246 (*)    Calcium 10.9 (*)    All other components within normal limits  CBC - Abnormal; Notable for the following:    Hemoglobin 11.4 (*)    HCT 33.3 (*)     MCV 79.9 (*)    RDW 14.7 (*)    All other components within normal limits  TROPONIN I    RADIOLOGY  Chest x-ray is normal  ____________________________________________  FINAL ASSESSMENT AND PLAN  Left arm pain, tendinitis  Plan: Patient's labs and imaging were dictated above. Patient had presented for left arm pain secondary to her work. Advised rest, anti-inflammatory as well as muscle relaxants. She is stable for discharge.   Emily FilbertWilliams, Cendy Oconnor E, MD   Note: This note was generated in part or whole with voice recognition software. Voice recognition is usually quite accurate but there are transcription errors that can and very often do occur. I apologize for any typographical errors that were not detected and corrected.     Emily FilbertWilliams, Cambrey Lupi E, MD 04/10/17 1021

## 2017-04-10 NOTE — ED Triage Notes (Signed)
Left chest wall pain today with dizziness , left arm x2 weeks

## 2017-04-10 NOTE — ED Notes (Signed)
Per Dr Mayford Knifewilliams, pt does not need iv or cardiac monitoring

## 2017-11-07 ENCOUNTER — Encounter: Payer: Self-pay | Admitting: Emergency Medicine

## 2017-11-07 ENCOUNTER — Emergency Department: Payer: BLUE CROSS/BLUE SHIELD

## 2017-11-07 ENCOUNTER — Emergency Department
Admission: EM | Admit: 2017-11-07 | Discharge: 2017-11-07 | Disposition: A | Discharge status: Home / Self Care | Payer: BLUE CROSS/BLUE SHIELD | Attending: Emergency Medicine | Admitting: Emergency Medicine

## 2017-11-07 DIAGNOSIS — Z5321 Procedure and treatment not carried out due to patient leaving prior to being seen by health care provider: Secondary | ICD-10-CM | POA: Insufficient documentation

## 2017-11-07 DIAGNOSIS — R079 Chest pain, unspecified: Secondary | ICD-10-CM | POA: Diagnosis present

## 2017-11-07 LAB — BASIC METABOLIC PANEL
ANION GAP: 10 (ref 5–15)
BUN: 13 mg/dL (ref 6–20)
CALCIUM: 10 mg/dL (ref 8.9–10.3)
CO2: 23 mmol/L (ref 22–32)
Chloride: 106 mmol/L (ref 101–111)
Creatinine, Ser: 0.86 mg/dL (ref 0.44–1.00)
GFR calc Af Amer: >60 mL/min (ref >60)
GLUCOSE: 193 mg/dL — AB (ref 65–99)
Potassium: 3.6 mmol/L (ref 3.5–5.1)
Sodium: 139 mmol/L (ref 135–145)

## 2017-11-07 LAB — CBC
HCT: 33.7 % — ABNORMAL LOW (ref 35.0–47.0)
HEMOGLOBIN: 11 g/dL — AB (ref 12.0–16.0)
MCH: 26.7 pg (ref 26.0–34.0)
MCHC: 32.7 g/dL (ref 32.0–36.0)
MCV: 81.8 fL (ref 80.0–100.0)
PLATELETS: 298 10*3/uL (ref 150–440)
RBC: 4.11 MIL/uL (ref 3.80–5.20)
RDW: 14.5 % (ref 11.5–14.5)
WBC: 7.4 10*3/uL (ref 3.6–11.0)

## 2017-11-07 LAB — TROPONIN I

## 2017-11-07 NOTE — ED Triage Notes (Signed)
Pt co chest pain started this am

## 2017-11-29 DIAGNOSIS — E119 Type 2 diabetes mellitus without complications: Secondary | ICD-10-CM | POA: Diagnosis not present

## 2017-11-29 DIAGNOSIS — F17210 Nicotine dependence, cigarettes, uncomplicated: Secondary | ICD-10-CM | POA: Insufficient documentation

## 2017-11-29 DIAGNOSIS — Z79899 Other long term (current) drug therapy: Secondary | ICD-10-CM | POA: Diagnosis not present

## 2017-11-29 DIAGNOSIS — R1013 Epigastric pain: Secondary | ICD-10-CM | POA: Diagnosis present

## 2017-11-29 DIAGNOSIS — Z7982 Long term (current) use of aspirin: Secondary | ICD-10-CM | POA: Diagnosis not present

## 2017-11-29 DIAGNOSIS — K859 Acute pancreatitis without necrosis or infection, unspecified: Secondary | ICD-10-CM | POA: Diagnosis not present

## 2017-11-29 DIAGNOSIS — K5900 Constipation, unspecified: Secondary | ICD-10-CM | POA: Insufficient documentation

## 2017-11-29 DIAGNOSIS — Z7984 Long term (current) use of oral hypoglycemic drugs: Secondary | ICD-10-CM | POA: Insufficient documentation

## 2017-11-29 NOTE — ED Notes (Signed)
ED EKG read by MD Rifenbark

## 2017-11-29 NOTE — ED Triage Notes (Signed)
Patient c/o medial/upper abdominal pain X since Monday. Patient reports she has not had a BM since Monday.

## 2017-11-30 ENCOUNTER — Emergency Department: Payer: BLUE CROSS/BLUE SHIELD

## 2017-11-30 ENCOUNTER — Emergency Department
Admission: EM | Admit: 2017-11-30 | Discharge: 2017-11-30 | Disposition: A | Discharge status: Home / Self Care | Payer: BLUE CROSS/BLUE SHIELD | Attending: Emergency Medicine | Admitting: Emergency Medicine

## 2017-11-30 DIAGNOSIS — R1013 Epigastric pain: Secondary | ICD-10-CM

## 2017-11-30 DIAGNOSIS — K5900 Constipation, unspecified: Secondary | ICD-10-CM

## 2017-11-30 DIAGNOSIS — K859 Acute pancreatitis without necrosis or infection, unspecified: Secondary | ICD-10-CM

## 2017-11-30 LAB — COMPREHENSIVE METABOLIC PANEL
ALT: 16 U/L (ref 14–54)
AST: 16 U/L (ref 15–41)
Albumin: 4.3 g/dL (ref 3.5–5.0)
Alkaline Phosphatase: 85 U/L (ref 38–126)
Anion gap: 9 (ref 5–15)
BUN: 16 mg/dL (ref 6–20)
CO2: 24 mmol/L (ref 22–32)
Calcium: 10 mg/dL (ref 8.9–10.3)
Chloride: 105 mmol/L (ref 101–111)
Creatinine, Ser: 0.74 mg/dL (ref 0.44–1.00)
GFR calc Af Amer: >60 mL/min (ref >60)
GFR calc non Af Amer: >60 mL/min (ref >60)
Glucose, Bld: 151 mg/dL — ABNORMAL HIGH (ref 65–99)
Potassium: 4 mmol/L (ref 3.5–5.1)
Sodium: 138 mmol/L (ref 135–145)
Total Bilirubin: 0.5 mg/dL (ref 0.3–1.2)
Total Protein: 8 g/dL (ref 6.5–8.1)

## 2017-11-30 LAB — TROPONIN I: Troponin I: <0.03 ng/mL (ref <0.03)

## 2017-11-30 LAB — LIPASE, BLOOD: Lipase: 661 U/L — ABNORMAL HIGH (ref 11–51)

## 2017-11-30 LAB — URINALYSIS, COMPLETE (UACMP) WITH MICROSCOPIC
BACTERIA UA: NONE SEEN
Bilirubin Urine: NEGATIVE
Glucose, UA: NEGATIVE mg/dL
Ketones, ur: NEGATIVE mg/dL
Leukocytes, UA: NEGATIVE
Nitrite: NEGATIVE
PROTEIN: NEGATIVE mg/dL
Specific Gravity, Urine: 1.014 (ref 1.005–1.030)
pH: 5 (ref 5.0–8.0)

## 2017-11-30 LAB — CBC
HCT: 34.4 % — ABNORMAL LOW (ref 35.0–47.0)
Hemoglobin: 11.3 g/dL — ABNORMAL LOW (ref 12.0–16.0)
MCH: 26.6 pg (ref 26.0–34.0)
MCHC: 32.9 g/dL (ref 32.0–36.0)
MCV: 81 fL (ref 80.0–100.0)
Platelets: 323 10*3/uL (ref 150–440)
RBC: 4.25 MIL/uL (ref 3.80–5.20)
RDW: 14.3 % (ref 11.5–14.5)
WBC: 10.9 10*3/uL (ref 3.6–11.0)

## 2017-11-30 LAB — ETHANOL

## 2017-11-30 MED ORDER — ONDANSETRON 4 MG PO TBDP: 4.0000 mg | ORAL_TABLET | Freq: Three times a day (TID) | ORAL | 0 refills | Status: DC | PRN

## 2017-11-30 MED ORDER — SODIUM CHLORIDE 0.9 % IV BOLUS
500.0000 mL | Freq: Once | INTRAVENOUS | Status: AC
  Administered 2017-11-30: 500 mL via INTRAVENOUS

## 2017-11-30 MED ORDER — ONDANSETRON HCL 4 MG/2ML IJ SOLN
4.0000 mg | Freq: Once | INTRAMUSCULAR | Status: AC
  Administered 2017-11-30: 4 mg via INTRAVENOUS
  Filled 2017-11-30: qty 2

## 2017-11-30 MED ORDER — FENTANYL CITRATE (PF) 100 MCG/2ML IJ SOLN
50.0000 ug | Freq: Once | INTRAMUSCULAR | Status: AC
Start: 2017-11-30 — End: 2017-11-30
  Administered 2017-11-30: 50 ug via INTRAVENOUS
  Filled 2017-11-30: qty 2

## 2017-11-30 MED ORDER — LACTULOSE 10 GM/15ML PO SOLN
30.0000 g | Freq: Once | ORAL | Status: DC
  Filled 2017-11-30: qty 60

## 2017-11-30 MED ORDER — ONDANSETRON HCL 4 MG/2ML IJ SOLN
INTRAMUSCULAR | Status: AC
  Administered 2017-11-30: 4 mg via INTRAVENOUS
  Filled 2017-11-30: qty 2

## 2017-11-30 MED ORDER — LACTULOSE 10 GM/15ML PO SOLN: 20.0000 g | Freq: Every day | ORAL | 0 refills | Status: DC | PRN

## 2017-11-30 MED ORDER — ONDANSETRON HCL 4 MG/2ML IJ SOLN
4.0000 mg | Freq: Once | INTRAMUSCULAR | Status: AC
  Administered 2017-11-30: 4 mg via INTRAVENOUS

## 2017-11-30 NOTE — ED Notes (Signed)
Pt returned from US with 2 episodes of emesis as well as soiling herself with urine. Pts clothing changed and bed linens changed. See MAR for intervention.

## 2017-11-30 NOTE — ED Notes (Signed)
Pt at US with 2 episodes of emesis.

## 2017-11-30 NOTE — Discharge Instructions (Addendum)
1.  You may take Lactulose as needed for bowel movements. 2.  You may take Zofran as needed for nausea/vomiting. 3.  Clear liquid diet for the next day, then bland diet for the next 3 days, then slowly advance diet as tolerated.  Avoid heavy, greasy, spicy foods and alcohol. 4.  Return to the ER for worsening symptoms, persistent vomiting, difficulty breathing or other concerns.

## 2017-11-30 NOTE — ED Notes (Signed)
Pt had positive results with enema. Pt verbalizes feeling relief and is requesting to be sent home. MD made aware.

## 2017-11-30 NOTE — ED Provider Notes (Addendum)
[facility] Emergency Department Provider Note   ____________________________________________   First MD Initiated Contact with Patient 11/30/17 0158     (approximate)  I have reviewed the triage vital signs and the nursing notes.   HISTORY  Chief Complaint Abdominal Pain    HPI Joanna [name] is a 52 y.o. female who presents to the ED from home with a chief complaint of abdominal pain and constipation.  Patient reports epigastric abdominal pain worsened with eating.  [name] associated symptoms of fever, chills, nausea, vomiting, dysuria or diarrhea.  Also denies chest pain or shortness of breath.  States she has not had a bowel movement for 4 days; usually patient stools daily.  States she is still passing flatus.  Placed on Flexeril 2 weeks ago for shoulder pain.  Denies other changes in medications or diet.  Denies recent travel or trauma.   Past Medical History:  Diagnosis Date  . Diabetes mellitus without complication (HCC)     There are [name] active problems to display for this patient.   Past surgical history None  Prior to Admission medications   Medication Sig Start Date End Date Taking? Authorizing Provider  albuterol (PROVENTIL HFA;VENTOLIN HFA) 108 (90 Base) MCG/ACT inhaler Inhale 2-4 puffs by mouth every 4 hours as needed for wheezing, cough, and/or shortness of breath 05/29/16   Loleta Rose, MD  aspirin EC 81 MG tablet Take 81 mg by mouth daily. 12/29/14   [provider]  atorvastatin (LIPITOR) 40 MG tablet Take 40 mg by mouth daily. 01/20/15 04/10/17  [provider]  benzonatate (TESSALON PERLES) 100 MG capsule Take 1 capsule (100 mg total) by mouth every 6 (six) hours as needed for cough. 05/31/16   Rebecka Apley, MD  buPROPion [facility] SR) 150 MG 12 hr tablet Take 150 mg by mouth daily. 01/20/15   [provider]  chlorpheniramine-HYDROcodone (TUSSIONEX PENNKINETIC ER) 10-8 MG/5ML SUER Take 5 mLs by mouth 2  (two) times daily. 06/21/15   Irean Hong, MD  cyclobenzaprine (FLEXERIL) 10 MG tablet Take 1 tablet (10 mg total) by mouth 3 (three) times daily as needed for muscle spasms. 08/19/15   Myrna Blazer, MD  cyclobenzaprine (FLEXERIL) 10 MG tablet Take 1 tablet (10 mg total) by mouth every 8 (eight) hours as needed for muscle spasms. 08/21/15   Rebecka Apley, MD  diazepam (VALIUM) 5 MG tablet Take 1 tablet (5 mg total) by mouth every 8 (eight) hours as needed for muscle spasms. 04/10/17   Emily Filbert, MD  doxycycline (VIBRA-TABS) 100 MG tablet Take 1 tablet (100 mg total) by mouth 2 (two) times daily. 08/19/15   Schaevitz, Myra Rude, MD  Ferrous Gluconate 325 (36 FE) MG TABS Take 325 mg by mouth 2 (two) times daily. 12/29/14   [provider]  furosemide (LASIX) 20 MG tablet Take 20 mg by mouth daily. 04/27/15   [provider]  glipiZIDE (GLUCOTROL XL) 10 MG 24 hr tablet Take 10 mg by mouth daily with breakfast.    [provider]  HYDROcodone-acetaminophen (NORCO/VICODIN) 5-325 MG tablet Take 1-2 tablets by mouth every 4 hours as needed for moderate to severe pain and/or cough. 05/29/16   Loleta Rose, MD  ibuprofen (ADVIL,MOTRIN) 800 MG tablet Take 1 tablet (800 mg total) by mouth every 8 (eight) hours as needed. 04/10/17   Emily Filbert, MD  lactulose (CHRONULAC) 10 GM/15ML solution Take 30 mLs (20 g total) by mouth daily as needed  for mild constipation. 11/30/17   Irean Hong, MD  levofloxacin (LEVAQUIN) 750 MG tablet Take 1 tablet (750 mg total) by mouth daily. 06/21/15   Irean Hong, MD  lisinopril (PRINIVIL,ZESTRIL) 10 MG tablet Take 10 mg by mouth daily. 07/12/15   [provider]  metFORMIN (GLUCOPHAGE-XR) 500 MG 24 hr tablet Take 2,000 mg by mouth daily. 04/27/15   [provider]  omeprazole (PRILOSEC) 40 MG capsule Take 40 mg by mouth daily. 01/20/15 04/10/17  [provider]  ondansetron (ZOFRAN ODT) 4 MG  disintegrating tablet Take 1 tablet (4 mg total) by mouth every 8 (eight) hours as needed for nausea or vomiting. 11/30/17   Irean Hong, MD  predniSONE (DELTASONE) 20 MG tablet Take 3 tablets (60 mg total) by mouth daily. 05/31/16   Rebecka Apley, MD  traMADol (ULTRAM) 50 MG tablet Take 1 tablet (50 mg total) by mouth every 6 (six) hours as needed. 08/21/15   Rebecka Apley, MD    Allergies Patient has [name] known allergies.  [name] family history on file.  Social History Social History   Tobacco Use  . Smoking status: Current Every Day Smoker    Packs/day: 1.00    Types: Cigarettes  . Smokeless tobacco: Never Used  Substance Use Topics  . Alcohol use: [name]  . Drug use: Not on file    Review of Systems  Constitutional: [name] fever/chills. Eyes: [name] visual changes. ENT: [name] sore throat. Cardiovascular: Denies chest pain. Respiratory: Denies shortness of breath. Gastrointestinal: Positive for abdominal pain.  [name] nausea, [name] vomiting.  [name] diarrhea.  Positive for constipation. Genitourinary: Negative for dysuria. Musculoskeletal: Negative for back pain. Skin: Negative for rash. Neurological: Negative for headaches, focal weakness or numbness.   ____________________________________________   PHYSICAL EXAM:  VITAL SIGNS: ED Triage Vitals  Enc Vitals Group     BP 11/29/17 2353 (!) 150/92     Pulse Rate 11/29/17 2353 (!) 59     Resp 11/29/17 2353 19     Temp 11/29/17 2353 98 F (36.7 C)     Temp Source 11/29/17 2353 Oral     SpO2 11/29/17 2353 100 %     Weight 11/29/17 2354 156 lb (70.8 kg)     Height --      Head Circumference --      Peak Flow --      Pain Score 11/29/17 2353 10     Pain Loc --      Pain Edu? --      Excl. in GC? --     Constitutional: Alert and oriented. Well appearing and in [name] acute distress. Eyes: Conjunctivae are normal. PERRL. EOMI. Head: Atraumatic. Nose: [name] congestion/rhinnorhea. Mouth/Throat: Mucous membranes are moist.  Oropharynx  non-erythematous. Neck: [name] stridor.   Cardiovascular: Normal rate, regular rhythm. Grossly normal heart sounds.  Good peripheral circulation. Respiratory: Normal respiratory effort.  [name] retractions. Lungs CTAB. Gastrointestinal: Soft and mildly tender to palpation epigastrium without rebound or guarding. [name] distention. [name] abdominal bruits. [name] CVA tenderness. Musculoskeletal: [name] lower extremity tenderness nor edema.  [name] joint effusions. Neurologic:  Normal speech and language. [name] gross focal neurologic deficits are appreciated. [name] gait instability. Skin:  Skin is warm, dry and intact. [name] rash noted. Psychiatric: Mood and affect are normal. Speech and behavior are normal.  ____________________________________________   LABS (all labs ordered are listed, but only abnormal results are displayed)  Labs Reviewed  LIPASE, BLOOD - Abnormal; Notable for the following components:  Result Value   Lipase 661 (*)    All other components within normal limits  COMPREHENSIVE METABOLIC PANEL - Abnormal; Notable for the following components:   Glucose, Bld 151 (*)    All other components within normal limits  CBC - Abnormal; Notable for the following components:   Hemoglobin 11.3 (*)    HCT 34.4 (*)    All other components within normal limits  URINALYSIS, COMPLETE (UACMP) WITH MICROSCOPIC - Abnormal; Notable for the following components:   Color, Urine YELLOW (*)    APPearance CLEAR (*)    Hgb urine dipstick SMALL (*)    Squamous Epithelial / LPF 0-5 (*)    All other components within normal limits  TROPONIN I  ETHANOL   ____________________________________________  EKG  ED ECG REPORT I, Shacarra Choe J, the attending physician, personally viewed and interpreted this ECG.   Date: 11/30/2017  EKG Time: 2349  Rate: 68  Rhythm: normal EKG, normal sinus rhythm  Axis: Normal  Intervals:none  ST&T Change: Nonspecific  ____________________________________________  RADIOLOGY  ED MD  interpretation: Large stool burden, [name] SBO, [name] free air under the diaphragm; unremarkable ultrasound  Official radiology report(s): Dg Abd Acute W/chest  Result Date: 11/30/2017 CLINICAL DATA:  52 year old female with constipation and epigastric pain. EXAM: DG ABDOMEN ACUTE W/ 1V CHEST COMPARISON:  Chest radiograph dated 04/10/2017 FINDINGS: Mild diffuse peribronchial thickening may represent bronchitis or chronic airway disease. Clinical correlation is recommended. There is [name] focal consolidation, pleural effusion, or pneumothorax. The cardiac silhouette is within normal limits. There is large amount of stool throughout the colon. [name] bowel dilatation or evidence of obstruction. [name] free air or radiopaque calculi. The osseous structures and soft tissues are unremarkable. IMPRESSION: 1. [name] acute cardiopulmonary process. 2. Constipation.  [name] bowel obstruction. Electronically Signed   By: Elgie Collard M.D.   On: 11/30/2017 02:36   US Abdomen Limited Ruq  Result Date: 11/30/2017 CLINICAL DATA:  52 year old female with epigastric pain. EXAM: ULTRASOUND ABDOMEN LIMITED RIGHT UPPER QUADRANT COMPARISON:  None. FINDINGS: Gallbladder: [name] gallstones or wall thickening visualized. [name] sonographic Murphy sign noted by sonographer. Common bile duct: Diameter: 2 mm Liver: The liver is unremarkable. Portal vein is patent on color Doppler imaging with normal direction of blood flow towards the liver. IMPRESSION: Unremarkable right upper quadrant ultrasound. Electronically Signed   By: Elgie Collard M.D.   On: 11/30/2017 03:43    ____________________________________________   PROCEDURES  Procedure(s) performed: None  Procedures  Critical Care performed: [name]  ____________________________________________   INITIAL IMPRESSION / ASSESSMENT AND PLAN / ED COURSE  As part of my medical decision making, I reviewed the following data within the electronic MEDICAL RECORD NUMBER Nursing notes reviewed and  incorporated, Labs reviewed, EKG interpreted, Old chart reviewed, Radiograph reviewed and Notes from prior ED visits   52 year old female who presents with epigastric pain and constipation. Differential diagnosis includes, but is not limited to, biliary disease (biliary colic, acute cholecystitis, cholangitis, choledocholithiasis, etc), intrathoracic causes for epigastric abdominal pain including ACS, gastritis, duodenitis, pancreatitis, small bowel or large bowel obstruction, abdominal aortic aneurysm, hernia, and gastritis.  Laboratory and urinalysis results remarkable for elevated lipase.  Will initiate IV fluid resuscitation, obtain plain film x-rays and right upper quadrant abdominal ultrasound to evaluate for cholecystitis.  Clinical Course as of Nov 30 716  Fri Nov 30, 2017  0405 Updated patient of laboratory and imaging results.  Patient vomited when she returned from ultrasound.  Offered soapsuds enema which patient accepts.   [  JS]  0515 Patient had a large bowel movement after soapsuds enema.  She is smiling, satisfied and eager for discharge home.  We discussed dietary modifications for the next week for her mild pancreatitis.  Referred to hold narcotic pain medication as patient is already very constipated.  Will discharge home on lactulose and Zofran as needed.  [name] further emesis and patient was able to tolerate ice chips without vomiting.  She will follow-up closely with her PCP.  Strict return precautions given.  Patient and family member verbalize understanding and agree with plan of care.   [JS]    Clinical Course User Index [JS] Irean HongSung, Melia Hopes J, MD     ____________________________________________   FINAL CLINICAL IMPRESSION(S) / ED DIAGNOSES  Final diagnoses:  Epigastric pain  Constipation, unspecified constipation type  Acute pancreatitis without infection or necrosis, unspecified pancreatitis type     ED Discharge Orders        Ordered    lactulose (CHRONULAC) 10  GM/15ML solution  Daily PRN     11/30/17 0514    ondansetron (ZOFRAN ODT) 4 MG disintegrating tablet  Every 8 hours PRN     11/30/17 0514       Note:  This document was prepared using Dragon voice recognition software and may include unintentional dictation errors.    Irean HongSung, Mando Blatz J, MD 11/30/17 [id number]    Irean HongSung, Katora Fini J, MD 11/30/17 [phone]

## 2018-03-26 ENCOUNTER — Encounter: Payer: Self-pay | Admitting: Emergency Medicine

## 2018-03-26 ENCOUNTER — Emergency Department
Admission: EM | Admit: 2018-03-26 | Discharge: 2018-03-26 | Disposition: A | Discharge status: Home / Self Care | Payer: BLUE CROSS/BLUE SHIELD | Attending: Student in an Organized Health Care Education/Training Program | Admitting: Student in an Organized Health Care Education/Training Program

## 2018-03-26 ENCOUNTER — Other Ambulatory Visit: Payer: Self-pay

## 2018-03-26 DIAGNOSIS — Z7984 Long term (current) use of oral hypoglycemic drugs: Secondary | ICD-10-CM | POA: Insufficient documentation

## 2018-03-26 DIAGNOSIS — Z79899 Other long term (current) drug therapy: Secondary | ICD-10-CM | POA: Diagnosis not present

## 2018-03-26 DIAGNOSIS — M545 Low back pain, unspecified: Secondary | ICD-10-CM

## 2018-03-26 DIAGNOSIS — Z7982 Long term (current) use of aspirin: Secondary | ICD-10-CM | POA: Diagnosis not present

## 2018-03-26 DIAGNOSIS — F17210 Nicotine dependence, cigarettes, uncomplicated: Secondary | ICD-10-CM | POA: Insufficient documentation

## 2018-03-26 DIAGNOSIS — E119 Type 2 diabetes mellitus without complications: Secondary | ICD-10-CM | POA: Diagnosis not present

## 2018-03-26 LAB — URINALYSIS, COMPLETE (UACMP) WITH MICROSCOPIC
Bacteria, UA: NONE SEEN
Bilirubin Urine: NEGATIVE
Glucose, UA: NEGATIVE mg/dL
Hgb urine dipstick: NEGATIVE
KETONES UR: NEGATIVE mg/dL
Leukocytes, UA: NEGATIVE
Nitrite: NEGATIVE
PH: 5 (ref 5.0–8.0)
Protein, ur: NEGATIVE mg/dL
SPECIFIC GRAVITY, URINE: 1.016 (ref 1.005–1.030)

## 2018-03-26 MED ORDER — KETOROLAC TROMETHAMINE 30 MG/ML IJ SOLN
30.0000 mg | Freq: Once | INTRAMUSCULAR | Status: AC
  Administered 2018-03-26: 30 mg via INTRAMUSCULAR
  Filled 2018-03-26: qty 1

## 2018-03-26 MED ORDER — KETOROLAC TROMETHAMINE 10 MG PO TABS: 10.0000 mg | ORAL_TABLET | Freq: Four times a day (QID) | ORAL | 0 refills | Status: DC | PRN

## 2018-03-26 NOTE — ED Notes (Signed)
Pt reports pain to her left mid to lower back for about a week. Reports hx of kidney stones, uti and bladder infections. States no pain with urination and has frequent urination due to being diabetic but has not noticed any increase in her normal habit. Pt reports the pain radiates down but not towards her buttock more towards her hip. When asked if this pain feels like any of her prior episodes of utis or kidney stones patient says she does not know that she just wants to be seen by a doctor and get something for her pain.

## 2018-03-26 NOTE — ED Provider Notes (Signed)
[facility] Emergency Department Provider Note ____________________________________________  Time seen: Approximately 11:13 PM  I have reviewed the triage vital signs and the nursing notes.   HISTORY  Chief Complaint Back Pain    HPI Joanna [name] is a 52 y.o. female who presents to the emergency department for evaluation and treatment of left side middle lower back pain has been present for approximately 1 week.  She has a history of kidney stones, urinary tract infection, and kidney infection.  She denies any pain with urination and states that she has frequent urination at baseline because she is diabetic but has not noticed any increase.  Patient states that the symptoms are not really similar to previous kidney stone.  She works in a [employer] and her job requires repetitive motion.  She denies any recent injury that she can recall.  She has taken ibuprofen without relief.  Past Medical History:  Diagnosis Date  . Diabetes mellitus without complication (HCC)     There are no active problems to display for this patient.   History reviewed. No pertinent surgical history.  Prior to Admission medications   Medication Sig Start Date End Date Taking? Authorizing Provider  albuterol (PROVENTIL HFA;VENTOLIN HFA) 108 (90 Base) MCG/ACT inhaler Inhale 2-4 puffs by mouth every 4 hours as needed for wheezing, cough, and/or shortness of breath 05/29/16   Loleta RoseForbach, Cory, MD  aspirin EC 81 MG tablet Take 81 mg by mouth daily. 12/29/14   [provider]  atorvastatin (LIPITOR) 40 MG tablet Take 40 mg by mouth daily. 01/20/15 04/10/17  [provider]  benzonatate (TESSALON PERLES) 100 MG capsule Take 1 capsule (100 mg total) by mouth every 6 (six) hours as needed for cough. 05/31/16   Rebecka ApleyWebster, Allison P, MD  buPROPion [facility](WELLBUTRIN SR) 150 MG 12 hr tablet Take 150 mg by mouth daily. 01/20/15   [provider]  chlorpheniramine-HYDROcodone (TUSSIONEX  PENNKINETIC ER) 10-8 MG/5ML SUER Take 5 mLs by mouth 2 (two) times daily. 06/21/15   Irean HongSung, Jade J, MD  cyclobenzaprine (FLEXERIL) 10 MG tablet Take 1 tablet (10 mg total) by mouth 3 (three) times daily as needed for muscle spasms. 08/19/15   Myrna BlazerSchaevitz, David Matthew, MD  cyclobenzaprine (FLEXERIL) 10 MG tablet Take 1 tablet (10 mg total) by mouth every 8 (eight) hours as needed for muscle spasms. 08/21/15   Rebecka ApleyWebster, Allison P, MD  diazepam (VALIUM) 5 MG tablet Take 1 tablet (5 mg total) by mouth every 8 (eight) hours as needed for muscle spasms. 04/10/17   Emily FilbertWilliams, Jonathan E, MD  doxycycline (VIBRA-TABS) 100 MG tablet Take 1 tablet (100 mg total) by mouth 2 (two) times daily. 08/19/15   Schaevitz, Myra Rudeavid Matthew, MD  Ferrous Gluconate 325 (36 FE) MG TABS Take 325 mg by mouth 2 (two) times daily. 12/29/14   [provider]  furosemide (LASIX) 20 MG tablet Take 20 mg by mouth daily. 04/27/15   [provider]  glipiZIDE (GLUCOTROL XL) 10 MG 24 hr tablet Take 10 mg by mouth daily with breakfast.    [provider]  HYDROcodone-acetaminophen (NORCO/VICODIN) 5-325 MG tablet Take 1-2 tablets by mouth every 4 hours as needed for moderate to severe pain and/or cough. 05/29/16   Loleta RoseForbach, Cory, MD  ibuprofen (ADVIL,MOTRIN) 800 MG tablet Take 1 tablet (800 mg total) by mouth every 8 (eight) hours as needed. 04/10/17   Emily FilbertWilliams, Jonathan E, MD  ketorolac (TORADOL) 10 MG tablet Take 1 tablet (10 mg total) by  mouth every 6 (six) hours as needed. 03/26/18   Noel Rodier, Rulon Eisenmenger B, FNP  lactulose (CHRONULAC) 10 GM/15ML solution Take 30 mLs (20 g total) by mouth daily as needed for mild constipation. 11/30/17   Irean Hong, MD  levofloxacin (LEVAQUIN) 750 MG tablet Take 1 tablet (750 mg total) by mouth daily. 06/21/15   Irean Hong, MD  lisinopril (PRINIVIL,ZESTRIL) 10 MG tablet Take 10 mg by mouth daily. 07/12/15   [provider]  metFORMIN (GLUCOPHAGE-XR) 500 MG 24 hr tablet Take 2,000 mg by  mouth daily. 04/27/15   [provider]  omeprazole (PRILOSEC) 40 MG capsule Take 40 mg by mouth daily. 01/20/15 04/10/17  [provider]  ondansetron (ZOFRAN ODT) 4 MG disintegrating tablet Take 1 tablet (4 mg total) by mouth every 8 (eight) hours as needed for nausea or vomiting. 11/30/17   Irean Hong, MD  predniSONE (DELTASONE) 20 MG tablet Take 3 tablets (60 mg total) by mouth daily. 05/31/16   Rebecka Apley, MD  traMADol (ULTRAM) 50 MG tablet Take 1 tablet (50 mg total) by mouth every 6 (six) hours as needed. 08/21/15   Rebecka Apley, MD    Allergies Patient has no known allergies.  No family history on file.  Social History Social History   Tobacco Use  . Smoking status: Current Every Day Smoker    Packs/day: 1.00    Types: Cigarettes  . Smokeless tobacco: Never Used  Substance Use Topics  . Alcohol use: No  . Drug use: Not on file    Review of Systems Constitutional: Negative for fever. Cardiovascular: Negative for chest pain. Respiratory: Negative for shortness of breath. Musculoskeletal: Positive for left lower back pain Skin: Negative for wound or lesion Neurological: Negative for decrease in sensation  ____________________________________________   PHYSICAL EXAM:  VITAL SIGNS: ED Triage Vitals  Enc Vitals Group     BP 03/26/18 2231 137/63     Pulse Rate 03/26/18 2048 65     Resp 03/26/18 2048 20     Temp 03/26/18 2048 98.3 F (36.8 C)     Temp Source 03/26/18 2048 Oral     SpO2 03/26/18 2048 99 %     Weight 03/26/18 2049 167 lb (75.8 kg)     Height 03/26/18 2049 5\' 7"  (1.702 m)     Head Circumference --      Peak Flow --      Pain Score 03/26/18 2048 10     Pain Loc --      Pain Edu? --      Excl. in GC? --     Constitutional: Alert and oriented. Well appearing and in no acute distress. Eyes: Conjunctivae are clear without discharge or drainage Head: Atraumatic Neck: Supple Respiratory: No cough. Respirations are even  and unlabored. Musculoskeletal: Pain in the left lower back is reproducible with movement.  No CVA tenderness is appreciated on exam. Neurologic: No radiculopathy.  Straight leg raise is negative. Skin: Intact over area of pain.  No rash, lesion, or wound noted. Psychiatric: Affect and behavior are appropriate.  ____________________________________________   LABS (all labs ordered are listed, but only abnormal results are displayed)  Labs Reviewed  URINALYSIS, COMPLETE (UACMP) WITH MICROSCOPIC - Abnormal; Notable for the following components:      Result Value   Color, Urine YELLOW (*)    APPearance CLEAR (*)    All other components within normal limits   ____________________________________________  RADIOLOGY  Not indicated ____________________________________________  PROCEDURES  Procedures  ____________________________________________   INITIAL IMPRESSION / ASSESSMENT AND PLAN / ED COURSE  Kaileia C Almanzar is a 52 y.o. who presents to the emergency department for treatment and evaluation of left lower back pain.  Urinalysis is negative for acute findings.  Patient will be treated for musculoskeletal pain.  She was given a Toradol injection while here and will be given a prescription for the same.  Patient was instructed to follow-up with her primary care provider for symptoms that are not improving over the next few days.  She was provided a work note for today and tomorrow.  She is to return to the emergency department for symptoms of change or worsen if unable to schedule appointment.  Medications  ketorolac (TORADOL) 30 MG/ML injection 30 mg (30 mg Intramuscular Given 03/26/18 2224)    Pertinent labs & imaging results that were available during my care of the patient were reviewed by me and considered in my medical decision making (see chart for details).  _________________________________________   FINAL CLINICAL IMPRESSION(S) / ED DIAGNOSES  Final diagnoses:   Acute left-sided low back pain without sciatica    ED Discharge Orders        Ordered    ketorolac (TORADOL) 10 MG tablet  Every 6 hours PRN    Note to Pharmacy:  Pt. Had injection in the ER   03/26/18 2221       If controlled substance prescribed during this visit, 12 month history viewed on the [facility] prior to issuing an initial prescription for Schedule II or III opiod.    Chinita Pester, FNP 03/26/18 2317    Willy Eddy, MD 03/26/18 [phone]

## 2018-03-26 NOTE — ED Notes (Signed)
Pt asking for d/c papers and eating chips. I told pt I had pain medicine but she would have to stay at least 15 to 20 minutes post injection to monitor for reactions. Pt agreed to stay and take medicine.

## 2018-03-26 NOTE — ED Triage Notes (Addendum)
Patient to ER for c/o left lower back pain. Denies any fevers or urinary symptoms. Denies any known injury. Patient states pain has been present for approx one week, and shoots into left hip. Patient ambulatory to triage without difficulty.

## 2018-06-27 ENCOUNTER — Encounter: Payer: Self-pay | Admitting: Emergency Medicine

## 2018-06-27 ENCOUNTER — Emergency Department
Admission: EM | Admit: 2018-06-27 | Discharge: 2018-06-27 | Disposition: A | Discharge status: Home / Self Care | Payer: BLUE CROSS/BLUE SHIELD | Attending: Emergency Medicine | Admitting: Emergency Medicine

## 2018-06-27 ENCOUNTER — Other Ambulatory Visit: Payer: Self-pay

## 2018-06-27 DIAGNOSIS — J069 Acute upper respiratory infection, unspecified: Secondary | ICD-10-CM | POA: Diagnosis not present

## 2018-06-27 DIAGNOSIS — Z7982 Long term (current) use of aspirin: Secondary | ICD-10-CM | POA: Diagnosis not present

## 2018-06-27 DIAGNOSIS — F17210 Nicotine dependence, cigarettes, uncomplicated: Secondary | ICD-10-CM | POA: Diagnosis not present

## 2018-06-27 DIAGNOSIS — R05 Cough: Secondary | ICD-10-CM | POA: Diagnosis present

## 2018-06-27 DIAGNOSIS — Z7984 Long term (current) use of oral hypoglycemic drugs: Secondary | ICD-10-CM | POA: Diagnosis not present

## 2018-06-27 DIAGNOSIS — Z79899 Other long term (current) drug therapy: Secondary | ICD-10-CM | POA: Insufficient documentation

## 2018-06-27 DIAGNOSIS — E119 Type 2 diabetes mellitus without complications: Secondary | ICD-10-CM | POA: Diagnosis not present

## 2018-06-27 MED ORDER — HYDROCOD POLST-CPM POLST ER 10-8 MG/5ML PO SUER: 5.0000 mL | Freq: Two times a day (BID) | ORAL | 0 refills | Status: DC

## 2018-06-27 NOTE — ED Notes (Signed)
See triage note  Stats she developed cough and congestion about 3 days ago   Unsure of fever but afebrile on arrival also having sore throat and discomfort in chest and abd with cough

## 2018-06-27 NOTE — Discharge Instructions (Signed)
Please follow up with your primary care provider if not improving over the week.  Return to the ER for symptoms that change or worsen if unable to schedule an appointment. 

## 2018-06-27 NOTE — ED Triage Notes (Signed)
Says sick for 3 days with cough and sore throat.  Says her abd and chest hurt from couging.  Her dr called in tessalon perls yesterday, but that is not heping her cough.

## 2018-06-28 NOTE — ED Provider Notes (Signed)
[facility] Emergency Department Provider Note  ____________________________________________  Time seen: Approximately 2:12 PM  I have reviewed the triage vital signs and the nursing notes.   HISTORY  Chief Complaint Cough and Sore Throat   HPI Joanna [name] is a 52 y.o. female who presents to the emergency department for treatment and evaluation of cough  and congestion that started 3 days ago.  She is also had some sore throat and states that her chest and stomach are sore because of all the coughing.  No relief with over-the-counter medicines.  Her PCP called in [facility] yesterday but they are not helping at all.   Past Medical History:  Diagnosis Date  . Diabetes mellitus without complication (HCC)     There are no active problems to display for this patient.   History reviewed. No pertinent surgical history.  Prior to Admission medications   Medication Sig Start Date End Date Taking? Authorizing Provider  albuterol (PROVENTIL HFA;VENTOLIN HFA) 108 (90 Base) MCG/ACT inhaler Inhale 2-4 puffs by mouth every 4 hours as needed for wheezing, cough, and/or shortness of breath 05/29/16   Loleta Rose, MD  aspirin EC 81 MG tablet Take 81 mg by mouth daily. 12/29/14   [provider]  atorvastatin (LIPITOR) 40 MG tablet Take 40 mg by mouth daily. 01/20/15 04/10/17  [provider]  benzonatate (TESSALON PERLES) 100 MG capsule Take 1 capsule (100 mg total) by mouth every 6 (six) hours as needed for cough. 05/31/16   Rebecka Apley, MD  buPROPion [facility] SR) 150 MG 12 hr tablet Take 150 mg by mouth daily. 01/20/15   [provider]  chlorpheniramine-HYDROcodone (TUSSIONEX PENNKINETIC ER) 10-8 MG/5ML SUER Take 5 mLs by mouth 2 (two) times daily. 06/27/18   Tonie Vizcarrondo, Rulon Eisenmenger B, FNP  cyclobenzaprine (FLEXERIL) 10 MG tablet Take 1 tablet (10 mg total) by mouth 3 (three) times daily as needed for muscle spasms. 08/19/15   Myrna Blazer, MD  cyclobenzaprine (FLEXERIL) 10 MG tablet Take 1 tablet (10 mg total) by mouth every 8 (eight) hours as needed for muscle spasms. 08/21/15   Rebecka Apley, MD  diazepam (VALIUM) 5 MG tablet Take 1 tablet (5 mg total) by mouth every 8 (eight) hours as needed for muscle spasms. 04/10/17   Emily Filbert, MD  doxycycline (VIBRA-TABS) 100 MG tablet Take 1 tablet (100 mg total) by mouth 2 (two) times daily. 08/19/15   Schaevitz, Myra Rude, MD  Ferrous Gluconate 325 (36 FE) MG TABS Take 325 mg by mouth 2 (two) times daily. 12/29/14   [provider]  furosemide (LASIX) 20 MG tablet Take 20 mg by mouth daily. 04/27/15   [provider]  glipiZIDE (GLUCOTROL XL) 10 MG 24 hr tablet Take 10 mg by mouth daily with breakfast.    [provider]  HYDROcodone-acetaminophen (NORCO/VICODIN) 5-325 MG tablet Take 1-2 tablets by mouth every 4 hours as needed for moderate to severe pain and/or cough. 05/29/16   Loleta Rose, MD  ibuprofen (ADVIL,MOTRIN) 800 MG tablet Take 1 tablet (800 mg total) by mouth every 8 (eight) hours as needed. 04/10/17   Emily Filbert, MD  ketorolac (TORADOL) 10 MG tablet Take 1 tablet (10 mg total) by mouth every 6 (six) hours as needed. 03/26/18   Katelynne Revak, Rulon Eisenmenger B, FNP  lactulose (CHRONULAC) 10 GM/15ML solution Take 30 mLs (20 g total) by mouth daily as needed for mild constipation. 11/30/17   Irean Hong, MD  levofloxacin (LEVAQUIN) 750 MG tablet Take 1 tablet (750 mg total) by mouth daily. 06/21/15   Irean Hong, MD  lisinopril (PRINIVIL,ZESTRIL) 10 MG tablet Take 10 mg by mouth daily. 07/12/15   [provider]  metFORMIN (GLUCOPHAGE-XR) 500 MG 24 hr tablet Take 2,000 mg by mouth daily. 04/27/15   [provider]  omeprazole (PRILOSEC) 40 MG capsule Take 40 mg by mouth daily. 01/20/15 04/10/17  [provider]  ondansetron (ZOFRAN ODT) 4 MG disintegrating tablet Take 1 tablet (4 mg total) by mouth every 8  (eight) hours as needed for nausea or vomiting. 11/30/17   Irean Hong, MD  predniSONE (DELTASONE) 20 MG tablet Take 3 tablets (60 mg total) by mouth daily. 05/31/16   Rebecka Apley, MD  traMADol (ULTRAM) 50 MG tablet Take 1 tablet (50 mg total) by mouth every 6 (six) hours as needed. 08/21/15   Rebecka Apley, MD    Allergies Patient has no known allergies.  No family history on file.  Social History Social History   Tobacco Use  . Smoking status: Current Every Day Smoker    Packs/day: 1.00    Types: Cigarettes  . Smokeless tobacco: Never Used  Substance Use Topics  . Alcohol use: No  . Drug use: Not on file    Review of Systems Constitutional: Negative for fever/chills.  Normal appetite. ENT: Positive sore throat. Cardiovascular: Denies chest pain. Respiratory: Negative for shortness of breath.  Positive for cough.  Negative for wheezing.  Gastrointestinal: Negative for nausea, no vomiting.  No diarrhea.  Musculoskeletal: Negative for body aches Skin: Negative for rash. Neurological: Negative for headaches ____________________________________________   PHYSICAL EXAM:  VITAL SIGNS: ED Triage Vitals  Enc Vitals Group     BP 06/27/18 0731 (!) 161/71     Pulse Rate 06/27/18 0729 65     Resp 06/27/18 0729 16     Temp 06/27/18 0729 98.4 F (36.9 C)     Temp Source 06/27/18 0729 Oral     SpO2 06/27/18 0729 100 %     Weight 06/27/18 0730 161 lb (73 kg)     Height 06/27/18 0730 5\' 7"  (1.702 m)     Head Circumference --      Peak Flow --      Pain Score 06/27/18 0730 9     Pain Loc --      Pain Edu? --      Excl. in GC? --     Constitutional: Alert and oriented.  Acutely ill appearing and in no acute distress. Eyes: Conjunctivae are normal. Ears: Bilateral tympanic membranes are injected but otherwise normal Nose: Maxillary sinus congestion noted; no rhinnorhea. Mouth/Throat: Mucous membranes are moist.  Oropharynx clear. Tonsils not visualized. Uvula  midline. Neck: No stridor.  Lymphatic: No cervical lymphadenopathy. Cardiovascular: Normal rate, regular rhythm. Good peripheral circulation. Respiratory: Respirations are even and unlabored.  No retractions.  Breath sounds are clear to auscultation throughout. Gastrointestinal: Soft and nontender.  Musculoskeletal: FROM x 4 extremities.  Neurologic:  Normal speech and language. Skin:  Skin is warm, dry and intact. No rash noted. Psychiatric: Mood and affect are normal. Speech and behavior are normal.  ____________________________________________   LABS (all labs ordered are listed, but only abnormal results are displayed)  Labs Reviewed - No data to display ____________________________________________  EKG  Not indicated ____________________________________________  RADIOLOGY  Not indicated. ____________________________________________   PROCEDURES  Procedure(s) performed: None  Critical Care performed: No ____________________________________________   INITIAL  IMPRESSION / ASSESSMENT AND PLAN / ED COURSE  52 y.o. female who presents to the emergency department for treatment and evaluation of symptoms and exam most consistent with a viral URI.  She will be treated with Tussionex and advised to follow-up with her primary care provider if she does not get any better over the next couple of days.  She is to return to the emergency department if she cannot schedule an appointment.  Return precautions were discussed.    Medications - No data to display  ED Discharge Orders         Ordered    chlorpheniramine-HYDROcodone (TUSSIONEX PENNKINETIC ER) 10-8 MG/5ML SUER  2 times daily     06/27/18 0837           Pertinent labs & imaging results that were available during my care of the patient were reviewed by me and considered in my medical decision making (see chart for details).    If controlled substance prescribed during this visit, 12 month history viewed on the  [facility] prior to issuing an initial prescription for Schedule II or III opiod. ____________________________________________   FINAL CLINICAL IMPRESSION(S) / ED DIAGNOSES  Final diagnoses:  Viral upper respiratory tract infection    Note:  This document was prepared using Dragon voice recognition software and may include unintentional dictation errors.     Chinita Pester, FNP 06/28/18 1420    Governor Rooks, MD 06/29/18 [phone]

## 2018-07-22 ENCOUNTER — Emergency Department
Admission: EM | Admit: 2018-07-22 | Discharge: 2018-07-22 | Disposition: A | Discharge status: Home / Self Care | Payer: BLUE CROSS/BLUE SHIELD | Attending: Emergency Medicine | Admitting: Emergency Medicine

## 2018-07-22 ENCOUNTER — Emergency Department: Payer: BLUE CROSS/BLUE SHIELD

## 2018-07-22 ENCOUNTER — Encounter: Payer: Self-pay | Admitting: Emergency Medicine

## 2018-07-22 DIAGNOSIS — R0789 Other chest pain: Secondary | ICD-10-CM | POA: Diagnosis present

## 2018-07-22 DIAGNOSIS — Z7984 Long term (current) use of oral hypoglycemic drugs: Secondary | ICD-10-CM | POA: Diagnosis not present

## 2018-07-22 DIAGNOSIS — Z7982 Long term (current) use of aspirin: Secondary | ICD-10-CM | POA: Diagnosis not present

## 2018-07-22 DIAGNOSIS — F17210 Nicotine dependence, cigarettes, uncomplicated: Secondary | ICD-10-CM | POA: Insufficient documentation

## 2018-07-22 DIAGNOSIS — E119 Type 2 diabetes mellitus without complications: Secondary | ICD-10-CM | POA: Insufficient documentation

## 2018-07-22 DIAGNOSIS — R1011 Right upper quadrant pain: Secondary | ICD-10-CM | POA: Diagnosis not present

## 2018-07-22 DIAGNOSIS — Z79899 Other long term (current) drug therapy: Secondary | ICD-10-CM | POA: Diagnosis not present

## 2018-07-22 DIAGNOSIS — R079 Chest pain, unspecified: Secondary | ICD-10-CM

## 2018-07-22 LAB — CBC
HCT: 36.4 % (ref 36.0–46.0)
HEMOGLOBIN: 11.3 g/dL — AB (ref 12.0–15.0)
MCH: 25.9 pg — ABNORMAL LOW (ref 26.0–34.0)
MCHC: 31 g/dL (ref 30.0–36.0)
MCV: 83.3 fL (ref 80.0–100.0)
Platelets: 322 10*3/uL (ref 150–400)
RBC: 4.37 MIL/uL (ref 3.87–5.11)
RDW: 14.1 % (ref 11.5–15.5)
WBC: 7.3 10*3/uL (ref 4.0–10.5)
nRBC: 0 % (ref 0.0–0.2)

## 2018-07-22 LAB — COMPREHENSIVE METABOLIC PANEL
ALBUMIN: 3.9 g/dL (ref 3.5–5.0)
ALK PHOS: 68 U/L (ref 38–126)
ALT: 14 U/L (ref 0–44)
ANION GAP: 8 (ref 5–15)
AST: 18 U/L (ref 15–41)
BILIRUBIN TOTAL: 0.5 mg/dL (ref 0.3–1.2)
BUN: 9 mg/dL (ref 6–20)
CALCIUM: 9.3 mg/dL (ref 8.9–10.3)
CO2: 26 mmol/L (ref 22–32)
Chloride: 106 mmol/L (ref 98–111)
Creatinine, Ser: 0.69 mg/dL (ref 0.44–1.00)
GFR calc Af Amer: >60 mL/min (ref >60)
GFR calc non Af Amer: >60 mL/min (ref >60)
GLUCOSE: 116 mg/dL — AB (ref 70–99)
Potassium: 3.3 mmol/L — ABNORMAL LOW (ref 3.5–5.1)
SODIUM: 140 mmol/L (ref 135–145)
TOTAL PROTEIN: 7 g/dL (ref 6.5–8.1)

## 2018-07-22 LAB — TROPONIN I
Troponin I: <0.03 ng/mL (ref <0.03)
Troponin I: <0.03 ng/mL (ref <0.03)

## 2018-07-22 LAB — LIPASE, BLOOD: LIPASE: 23 U/L (ref 11–51)

## 2018-07-22 MED ORDER — ONDANSETRON HCL 4 MG/2ML IJ SOLN
4.0000 mg | Freq: Once | INTRAMUSCULAR | Status: AC
  Administered 2018-07-22: 4 mg via INTRAVENOUS

## 2018-07-22 MED ORDER — FENTANYL CITRATE (PF) 100 MCG/2ML IJ SOLN
100.0000 ug | Freq: Once | INTRAMUSCULAR | Status: AC
  Administered 2018-07-22: 100 ug via INTRAVENOUS

## 2018-07-22 MED ORDER — MISOPROSTOL 200 MCG PO TABS
200.0000 ug | ORAL_TABLET | Freq: Once | ORAL | Status: AC
  Administered 2018-07-22: 200 ug via ORAL
  Filled 2018-07-22: qty 1

## 2018-07-22 MED ORDER — METOCLOPRAMIDE HCL 5 MG/ML IJ SOLN
10.0000 mg | Freq: Once | INTRAMUSCULAR | Status: AC
  Administered 2018-07-22: 10 mg via INTRAVENOUS
  Filled 2018-07-22: qty 2

## 2018-07-22 MED ORDER — TRAMADOL HCL 50 MG PO TABS: 50.0000 mg | ORAL_TABLET | Freq: Four times a day (QID) | ORAL | 0 refills | Status: AC | PRN

## 2018-07-22 MED ORDER — IOPAMIDOL (ISOVUE-370) INJECTION 76%
100.0000 mL | Freq: Once | INTRAVENOUS | Status: AC | PRN
  Administered 2018-07-22: 100 mL via INTRAVENOUS

## 2018-07-22 MED ORDER — ONDANSETRON HCL 4 MG/2ML IJ SOLN
INTRAMUSCULAR | Status: AC
  Administered 2018-07-22: 4 mg via INTRAVENOUS
  Filled 2018-07-22: qty 2

## 2018-07-22 MED ORDER — ALUM & MAG HYDROXIDE-SIMETH 200-200-20 MG/5ML PO SUSP
30.0000 mL | Freq: Once | ORAL | Status: AC
  Administered 2018-07-22: 30 mL via ORAL
  Filled 2018-07-22: qty 30

## 2018-07-22 MED ORDER — FAMOTIDINE 20 MG PO TABS: 20.0000 mg | ORAL_TABLET | Freq: Two times a day (BID) | ORAL | 0 refills | Status: DC

## 2018-07-22 MED ORDER — SUCRALFATE 1 G PO TABS
1.0000 g | ORAL_TABLET | Freq: Once | ORAL | Status: AC
  Administered 2018-07-22: 1 g via ORAL
  Filled 2018-07-22: qty 1

## 2018-07-22 MED ORDER — METOCLOPRAMIDE HCL 10 MG PO TABS: 10.0000 mg | ORAL_TABLET | Freq: Three times a day (TID) | ORAL | 0 refills | Status: DC | PRN

## 2018-07-22 MED ORDER — KETOROLAC TROMETHAMINE 30 MG/ML IJ SOLN: 15.0000 mg | Freq: Once | INTRAMUSCULAR | Status: DC

## 2018-07-22 MED ORDER — FENTANYL CITRATE (PF) 100 MCG/2ML IJ SOLN
INTRAMUSCULAR | Status: AC
  Filled 2018-07-22: qty 2

## 2018-07-22 NOTE — ED Notes (Signed)
Pt to xray

## 2018-07-22 NOTE — ED Triage Notes (Signed)
Patient with central chest pain, shortness of breath and nausea that woke her from her sleep about 02:00 this am. Patient denies cardiac history.

## 2018-07-22 NOTE — Discharge Instructions (Signed)
Return to the ER for new, worsening, or persistent chest or abdominal pain, fevers, vomiting, weakness, or any other new or worsening symptoms that concern you.  Follow-up with your regular doctor.  Your CT shows some atherosclerosis (plaque buildup) in your aorta and the arteries leading to your legs.  These are not causing any symptoms right now but you should have your doctor look at the CT results that were done today and decide what follow-up you need.

## 2018-07-22 NOTE — ED Notes (Signed)
Patient transported to CT 

## 2018-07-22 NOTE — ED Provider Notes (Signed)
-----------------------------------------   12:19 PM on 07/22/2018 -----------------------------------------     Dionne BucySiadecki, Venola Castello, MD 07/22/18 [phone]

## 2018-07-22 NOTE — ED Notes (Signed)
Pt stated that she would like something to eat prior to taking medicine. Dr Marisa SeverinSiadecki notified and approved. Pt provided crackers and peanut butter.

## 2018-07-22 NOTE — ED Provider Notes (Signed)
[facility] Emergency Department Provider Note  ____________________________________________   First MD Initiated Contact with Patient 07/22/18 [phone]     (approximate)  I have reviewed the triage vital signs and the nursing notes.   HISTORY  Chief Complaint Chest Pain   HPI Joanna [name] is a 52 y.o. female who self presents to the emergency department with sudden onset severe lower chest upper abdominal pain that woke her from her sleep about an hour prior to arrival.  The pain was sudden onset severe sharp radiating straight to her back.  It has been completely constant.  Nothing seems to make it better or worse.  It is associated with shortness of breath.  No fevers or chills.  She has had some nausea but no vomiting.  No diarrhea.  She has a history of diabetes but no history of hypertension or coronary artery disease.  The pain was not ripping or tearing.  She has no history of DVT or pulmonary embolism.  No recent surgery travel or immobilization.    Past Medical History:  Diagnosis Date  . Diabetes mellitus without complication (HCC)     There are no active problems to display for this patient.   History reviewed. No pertinent surgical history.  Prior to Admission medications   Medication Sig Start Date End Date Taking? Authorizing Provider  albuterol (PROVENTIL HFA;VENTOLIN HFA) 108 (90 Base) MCG/ACT inhaler Inhale 2-4 puffs by mouth every 4 hours as needed for wheezing, cough, and/or shortness of breath 05/29/16   Loleta Rose, MD  aspirin EC 81 MG tablet Take 81 mg by mouth daily. 12/29/14   [provider]  atorvastatin (LIPITOR) 40 MG tablet Take 40 mg by mouth daily. 05/04/18   [provider]  benzonatate (TESSALON PERLES) 100 MG capsule Take 1 capsule (100 mg total) by mouth every 6 (six) hours as needed for cough. 05/31/16   Rebecka Apley, MD  buPROPion [facility] SR) 150 MG 12 hr tablet Take 150 mg by mouth daily.  01/20/15   [provider]  chlorpheniramine-HYDROcodone (TUSSIONEX PENNKINETIC ER) 10-8 MG/5ML SUER Take 5 mLs by mouth 2 (two) times daily. 06/27/18   Triplett, Rulon Eisenmenger B, FNP  cyclobenzaprine (FLEXERIL) 10 MG tablet Take 1 tablet (10 mg total) by mouth 3 (three) times daily as needed for muscle spasms. 08/19/15   Myrna Blazer, MD  cyclobenzaprine (FLEXERIL) 10 MG tablet Take 1 tablet (10 mg total) by mouth every 8 (eight) hours as needed for muscle spasms. 08/21/15   Rebecka Apley, MD  diazepam (VALIUM) 5 MG tablet Take 1 tablet (5 mg total) by mouth every 8 (eight) hours as needed for muscle spasms. 04/10/17   Emily Filbert, MD  doxycycline (VIBRA-TABS) 100 MG tablet Take 1 tablet (100 mg total) by mouth 2 (two) times daily. 08/19/15   Schaevitz, Myra Rude, MD  Ferrous Gluconate 325 (36 FE) MG TABS Take 325 mg by mouth 2 (two) times daily. 12/29/14   [provider]  furosemide (LASIX) 20 MG tablet Take 20 mg by mouth daily. 04/27/15   [provider]  glipiZIDE (GLUCOTROL XL) 10 MG 24 hr tablet Take 10 mg by mouth daily with breakfast.    [provider]  HYDROcodone-acetaminophen (NORCO/VICODIN) 5-325 MG tablet Take 1-2 tablets by mouth every 4 hours as needed for moderate to severe pain and/or cough. 05/29/16   Loleta Rose, MD  ibuprofen (ADVIL,MOTRIN) 800 MG tablet Take 1 tablet (800 mg total) by mouth  every 8 (eight) hours as needed. 04/10/17   Emily Filbert, MD  ketorolac (TORADOL) 10 MG tablet Take 1 tablet (10 mg total) by mouth every 6 (six) hours as needed. 03/26/18   Triplett, Rulon Eisenmenger B, FNP  lactulose (CHRONULAC) 10 GM/15ML solution Take 30 mLs (20 g total) by mouth daily as needed for mild constipation. 11/30/17   Irean Hong, MD  levofloxacin (LEVAQUIN) 750 MG tablet Take 1 tablet (750 mg total) by mouth daily. 06/21/15   Irean Hong, MD  lisinopril (PRINIVIL,ZESTRIL) 10 MG tablet Take 10 mg by mouth daily. 07/12/15   [provider]  metFORMIN (GLUCOPHAGE-XR) 500 MG 24 hr tablet Take 2,000 mg by mouth daily. 04/27/15   [provider]  omeprazole (PRILOSEC) 40 MG capsule Take 40 mg by mouth daily. 06/10/18   [provider]  ondansetron (ZOFRAN ODT) 4 MG disintegrating tablet Take 1 tablet (4 mg total) by mouth every 8 (eight) hours as needed for nausea or vomiting. 11/30/17   Irean Hong, MD  predniSONE (DELTASONE) 20 MG tablet Take 3 tablets (60 mg total) by mouth daily. 05/31/16   Rebecka Apley, MD  traMADol (ULTRAM) 50 MG tablet Take 1 tablet (50 mg total) by mouth every 6 (six) hours as needed. 08/21/15   Rebecka Apley, MD    Allergies Patient has no known allergies.  No family history on file.  Social History Social History   Tobacco Use  . Smoking status: Current Every Day Smoker    Packs/day: 1.00    Types: Cigarettes  . Smokeless tobacco: Never Used  Substance Use Topics  . Alcohol use: Yes    Comment: occ  . Drug use: Never    Review of Systems Constitutional: No fever/chills Eyes: No visual changes. ENT: No sore throat. Cardiovascular: Positive for chest pain. Respiratory: Positive for shortness of breath. Gastrointestinal: Positive for abdominal pain.  Positive for nausea, no vomiting.  No diarrhea.  No constipation. Genitourinary: Negative for dysuria. Musculoskeletal: Negative for back pain. Skin: Negative for rash. Neurological: Negative for headaches, focal weakness or numbness.   ____________________________________________   PHYSICAL EXAM:  VITAL SIGNS: ED Triage Vitals [07/22/18 0532]  Enc Vitals Group     BP (!) 158/73     Pulse Rate 66     Resp 16     Temp 98 F (36.7 C)     Temp Source Oral     SpO2 100 %     Weight 160 lb (72.6 kg)     Height 5\' 7"  (1.702 m)     Head Circumference      Peak Flow      Pain Score 7     Pain Loc      Pain Edu?      Excl. in GC?     Constitutional: Alert and oriented x4 turned onto her  side tearful and appears quite uncomfortable holding her upper abdomen/lower chest Eyes: PERRL EOMI. Head: Atraumatic. Nose: No congestion/rhinnorhea. Mouth/Throat: No trismus Neck: No stridor.   Cardiovascular: Normal rate, regular rhythm.  2 out of 6 systolic murmur best heard at right sternal border Respiratory: Somewhat increased respiratory effort.  No retractions. Lungs CTAB and moving good air Gastrointestinal: Soft mild epigastric tenderness with no rebound or guarding no peritonitis.  No McBurney's tenderness.  Negative Rovsing's.  Negative Murphy's Musculoskeletal: No lower extremity edema   Neurologic:  Normal speech and language. No gross focal neurologic deficits are appreciated. Skin: Mildly diaphoretic  Psychiatric: Mood and affect are normal. Speech and behavior are normal.    ____________________________________________   DIFFERENTIAL includes but not limited to  Aortic dissection, acute coronary syndrome, cholecystitis, cholangitis, biliary colic, pancreatitis, gastritis ____________________________________________   LABS (all labs ordered are listed, but only abnormal results are displayed)  Labs Reviewed  CBC - Abnormal; Notable for the following components:      Result Value   Hemoglobin 11.3 (*)    MCH 25.9 (*)    All other components within normal limits  LIPASE, BLOOD  COMPREHENSIVE METABOLIC PANEL  TROPONIN I  TROPONIN I    CBC is back reviewed by me showing slight anemia.  Remainder of labs hemolyzed and are pending at this time __________________________________________  EKG  ED ECG REPORT I, Merrily Brittle, the attending physician, personally viewed and interpreted this ECG.  Date: 07/22/2018 EKG Time:  Rate: 63 Rhythm: normal sinus rhythm QRS Axis: Leftward axis Intervals: normal ST/T Wave abnormalities: normal Narrative Interpretation: no evidence of acute ischemia  ____________________________________________  RADIOLOGY  CT  angiogram of the chest reviewed by me with no evidence of aortic dissection or pulmonary embolism Chest x-ray reviewed by me with no acute disease ____________________________________________   PROCEDURES  Procedure(s) performed: no  Procedures  Critical Care performed: no  ____________________________________________   INITIAL IMPRESSION / ASSESSMENT AND PLAN / ED COURSE  Pertinent labs & imaging results that were available during my care of the patient were reviewed by me and considered in my medical decision making (see chart for details).   As part of my medical decision making, I reviewed the following data within the electronic MEDICAL RECORD NUMBER History obtained from family if available, nursing notes, old chart and ekg, as well as notes from prior ED visits.  The patient comes to the emergency department very uncomfortable appearing with lower chest/upper abdominal pain that woke her from her sleep suddenly radiating straight to her back.  Her blood pressure today is elevated with an increased pulse pressure.  She has no known history of hypertension however sudden onset chest pain radiating straight to her back with new hypertension and increased pulse pressure raises concern for aortic dissection.  I have given her 100 mcg of IV fentanyl for pain control with good relief and will send her emergently to CT angiogram to evaluate for aortic dissection.  Remainder of lab work is pending at this time.  ----------------------------------------- 7:37 AM on 07/22/2018 -----------------------------------------  Fortunately the patient's CT angiogram is negative for acute aortic dissection.  Unclear etiology of her symptoms in the differential at this point includes pancreatitis, biliary disease, gastric disease, or unstable angina.  I have given her Carafate, Cytotec, and Maalox to cover her stomach pathology as well as 15 mg of IV ketorolac in case this is biliary.  All her lab work  including delta troponin are pending as well as a right upper quadrant ultrasound.  I signed the case over to my colleague Dr. Marisa Severin he will determine the ultimate disposition.  The patient's pain is significantly improved at this point and she verbalizes understanding and agreement with the plan.      ____________________________________________   FINAL CLINICAL IMPRESSION(S) / ED DIAGNOSES  Final diagnoses:  RUQ pain  Chest pain, unspecified type      NEW MEDICATIONS STARTED DURING THIS VISIT:  New Prescriptions   No medications on file     Note:  This document was prepared using Dragon voice recognition software and may include unintentional dictation errors.  Merrily Brittleifenbark, Anju Sereno, MD 07/22/18 [phone]

## 2018-11-06 ENCOUNTER — Other Ambulatory Visit: Payer: Self-pay

## 2018-11-06 ENCOUNTER — Emergency Department
Admission: EM | Admit: 2018-11-06 | Discharge: 2018-11-06 | Disposition: A | Discharge status: Home / Self Care | Payer: BLUE CROSS/BLUE SHIELD | Attending: Emergency Medicine | Admitting: Emergency Medicine

## 2018-11-06 DIAGNOSIS — Z7982 Long term (current) use of aspirin: Secondary | ICD-10-CM | POA: Insufficient documentation

## 2018-11-06 DIAGNOSIS — Z79899 Other long term (current) drug therapy: Secondary | ICD-10-CM | POA: Diagnosis not present

## 2018-11-06 DIAGNOSIS — E119 Type 2 diabetes mellitus without complications: Secondary | ICD-10-CM | POA: Insufficient documentation

## 2018-11-06 DIAGNOSIS — F17210 Nicotine dependence, cigarettes, uncomplicated: Secondary | ICD-10-CM | POA: Insufficient documentation

## 2018-11-06 DIAGNOSIS — M545 Low back pain, unspecified: Secondary | ICD-10-CM

## 2018-11-06 DIAGNOSIS — Z7984 Long term (current) use of oral hypoglycemic drugs: Secondary | ICD-10-CM | POA: Diagnosis not present

## 2018-11-06 LAB — URINALYSIS, COMPLETE (UACMP) WITH MICROSCOPIC
BACTERIA UA: NONE SEEN
BILIRUBIN URINE: NEGATIVE
Glucose, UA: NEGATIVE mg/dL
Hgb urine dipstick: NEGATIVE
KETONES UR: NEGATIVE mg/dL
LEUKOCYTE UA: NEGATIVE
Nitrite: NEGATIVE
Protein, ur: NEGATIVE mg/dL
Specific Gravity, Urine: 1.018 (ref 1.005–1.030)
pH: 5 (ref 5.0–8.0)

## 2018-11-06 MED ORDER — HYDROCODONE-ACETAMINOPHEN 5-325 MG PO TABS: 1.0000 | ORAL_TABLET | Freq: Four times a day (QID) | ORAL | 0 refills | Status: DC | PRN

## 2018-11-06 MED ORDER — CYCLOBENZAPRINE HCL 5 MG PO TABS: 5.0000 mg | ORAL_TABLET | Freq: Three times a day (TID) | ORAL | 0 refills | Status: DC | PRN

## 2018-11-06 MED ORDER — KETOROLAC TROMETHAMINE 30 MG/ML IJ SOLN
30.0000 mg | Freq: Once | INTRAMUSCULAR | Status: DC
  Filled 2018-11-06: qty 1

## 2018-11-06 MED ORDER — ONDANSETRON 4 MG PO TBDP
4.0000 mg | ORAL_TABLET | Freq: Once | ORAL | Status: AC
  Administered 2018-11-06: 4 mg via ORAL
  Filled 2018-11-06: qty 1

## 2018-11-06 MED ORDER — ONDANSETRON 4 MG PO TBDP: 4.0000 mg | ORAL_TABLET | Freq: Three times a day (TID) | ORAL | 0 refills | Status: DC | PRN

## 2018-11-06 NOTE — ED Notes (Signed)
See triage note  Presents with lower back pain which started on Sunday  States now pain is mainly on the left side   Non radiating at presents and denies any injury or urinary sxs'  Ambulates slowly to treatment room

## 2018-11-06 NOTE — ED Notes (Signed)
Offered Toradol for pain  States she thinks that it makes her vomit but not sure  States she would just take her rx.s and take meds when she gets home

## 2018-11-06 NOTE — ED Provider Notes (Signed)
[facility] Emergency Department Provider Note   ____________________________________________   None    (approximate)  I have reviewed the triage vital signs and the nursing notes.   HISTORY  Chief Complaint Back Pain   HPI Joanna [name] is a 53 y.o. female presents to the ED with complaint of low back pain that started 4 days ago.  Patient denies any known injury.  Patient states that pain mostly is on the left side of her lower back but currently denies any radiation into her buttocks or down her leg as previously documented.  She denies any sciatica in the past.  She denies any urinary symptoms or history of kidney stones.  Patient drove herself to the ED.  She rates her pain as a 10/10.       Past Medical History:  Diagnosis Date  . Diabetes mellitus without complication (HCC)     There are no active problems to display for this patient.   No past surgical history on file.  Prior to Admission medications   Medication Sig Start Date End Date Taking? Authorizing Provider  albuterol (PROVENTIL HFA;VENTOLIN HFA) 108 (90 Base) MCG/ACT inhaler Inhale 2-4 puffs by mouth every 4 hours as needed for wheezing, cough, and/or shortness of breath 05/29/16   Loleta Rose, MD  aspirin EC 81 MG tablet Take 81 mg by mouth daily. 12/29/14   [provider]  atorvastatin (LIPITOR) 40 MG tablet Take 40 mg by mouth daily. 05/04/18   [provider]  buPROPion (WELLBUTRIN SR) 150 MG 12 hr tablet Take 150 mg by mouth daily. 01/20/15   [provider]  cyclobenzaprine (FLEXERIL) 5 MG tablet Take 1 tablet (5 mg total) by mouth 3 (three) times daily as needed for muscle spasms. 11/06/18   Tommi Rumps, PA-C  famotidine (PEPCID) 20 MG tablet Take 1 tablet (20 mg total) by mouth 2 (two) times daily for 15 days. 07/22/18 08/06/18  Dionne Bucy, MD  Ferrous Gluconate 325 (36 FE) MG TABS Take 325 mg by mouth 2 (two) times daily. 12/29/14    [provider]  furosemide (LASIX) 20 MG tablet Take 20 mg by mouth daily. 04/27/15   [provider]  glipiZIDE (GLUCOTROL XL) 10 MG 24 hr tablet Take 10 mg by mouth daily with breakfast.    [provider]  HYDROcodone-acetaminophen (NORCO/VICODIN) 5-325 MG tablet Take 1 tablet by mouth every 6 (six) hours as needed for moderate pain. 11/06/18   Tommi Rumps, PA-C  lactulose (CHRONULAC) 10 GM/15ML solution Take 30 mLs (20 g total) by mouth daily as needed for mild constipation. Patient not taking: Reported on 07/22/2018 11/30/17   Irean Hong, MD  lisinopril (PRINIVIL,ZESTRIL) 10 MG tablet Take 10 mg by mouth daily. 07/12/15   [provider]  metFORMIN (GLUCOPHAGE-XR) 500 MG 24 hr tablet Take 2,000 mg by mouth daily. 04/27/15   [provider]  metoCLOPramide (REGLAN) 10 MG tablet Take 1 tablet (10 mg total) by mouth every 8 (eight) hours as needed for up to 5 days for nausea or vomiting. 07/22/18 07/27/18  Dionne Bucy, MD  omeprazole (PRILOSEC) 40 MG capsule Take 40 mg by mouth daily. 06/10/18   [provider]  ondansetron (ZOFRAN ODT) 4 MG disintegrating tablet Take 1 tablet (4 mg total) by mouth every 8 (eight) hours as needed for nausea or vomiting. 11/06/18   Tommi Rumps, PA-C    Allergies Patient has no known allergies.  No family  history on file.  Social History Social History   Tobacco Use  . Smoking status: Current Every Day Smoker    Packs/day: 1.00    Types: Cigarettes  . Smokeless tobacco: Never Used  Substance Use Topics  . Alcohol use: Yes    Comment: occ  . Drug use: Never    Review of Systems Constitutional: No fever/chills Cardiovascular: Denies chest pain. Respiratory: Denies shortness of breath. Gastrointestinal: No abdominal pain.  No nausea, no vomiting. Genitourinary: Negative for dysuria. Musculoskeletal: Positive for low back pain. Skin: Negative for rash. Neurological: Negative for  headaches, focal weakness or numbness. ____________________________________________   PHYSICAL EXAM:  VITAL SIGNS: ED Triage Vitals  Enc Vitals Group     BP 11/06/18 0603 (!) 170/81     Pulse Rate 11/06/18 0602 62     Resp 11/06/18 0602 20     Temp 11/06/18 0602 98.2 F (36.8 C)     Temp Source 11/06/18 0602 Oral     SpO2 11/06/18 0602 100 %     Weight 11/06/18 0603 162 lb (73.5 kg)     Height 11/06/18 0603 5\' 7"  (1.702 m)     Head Circumference --      Peak Flow --      Pain Score 11/06/18 0602 10     Pain Loc --      Pain Edu? --      Excl. in GC? --    Constitutional: Alert and oriented. Well appearing and in no acute distress. Eyes: Conjunctivae are normal.  Head: Atraumatic. Neck: No stridor.   Cardiovascular: Normal rate, regular rhythm. Grossly normal heart sounds.  Good peripheral circulation. Respiratory: Normal respiratory effort.  No retractions. Lungs CTAB. Gastrointestinal: Soft and nontender. No distention.  No CVA tenderness. Musculoskeletal: Emanation of the back there is no gross deformity.  There is no point tenderness on palpation of the lumbar spine or step-offs.  There is moderate tenderness on palpation of the left paravertebral muscles but no active muscle spasms were seen.  Range of motion is guarded.  Straight leg raises were negative.  Good muscle strength bilaterally.  Normal gait was noted. Neurologic:  Normal speech and language. No gross focal neurologic deficits are appreciated.  Reflexes were 2+ bilaterally.  No gait instability. Skin:  Skin is warm, dry and intact.  Psychiatric: Mood and affect are normal. Speech and behavior are normal.  ____________________________________________   LABS (all labs ordered are listed, but only abnormal results are displayed)  Labs Reviewed  URINALYSIS, COMPLETE (UACMP) WITH MICROSCOPIC - Abnormal; Notable for the following components:      Result Value   Color, Urine YELLOW (*)    APPearance CLEAR (*)      All other components within normal limits    PROCEDURES  Procedure(s) performed (including Critical Care):  Procedures   ____________________________________________   INITIAL IMPRESSION / ASSESSMENT AND PLAN / ED COURSE  As part of my medical decision making, I reviewed the following data within the electronic MEDICAL RECORD NUMBER Notes from prior ED visits and [place] Controlled Substance Database  Patient presents to the ED with complaint of left lower back pain that began 4 days ago.  Patient denies any known history of injury.  There was no urinary symptoms and urinalysis was negative.  Patient drove to the ED and was offered Toradol which she refused.  Patient was given Zofran while in the ED.  A prescription for Flexeril 5 mg was sent to her pharmacy as well  as Norco 1 every 6 hours as needed for pain.  She is encouraged to use ice or heat to her back as needed for discomfort and follow-up with her PCP or congenital clinic acute care if any continued problems.  ____________________________________________   FINAL CLINICAL IMPRESSION(S) / ED DIAGNOSES  Final diagnoses:  Acute left-sided low back pain without sciatica     ED Discharge Orders         Ordered    cyclobenzaprine (FLEXERIL) 5 MG tablet  3 times daily PRN     11/06/18 0731    HYDROcodone-acetaminophen (NORCO/VICODIN) 5-325 MG tablet  Every 6 hours PRN     11/06/18 0731    ondansetron (ZOFRAN ODT) 4 MG disintegrating tablet  Every 8 hours PRN     11/06/18 0744           Note:  This document was prepared using Dragon voice recognition software and may include unintentional dictation errors.    Tommi Rumps, PA-C 11/06/18 1636    Jene Every, MD 11/07/18 1146

## 2018-11-06 NOTE — ED Triage Notes (Signed)
Pt in with co left lower back pain since Sunday states radiates down left buttocks. Denies any injury, worse when she walks.

## 2018-11-06 NOTE — Discharge Instructions (Addendum)
Follow-up with your doctor or can no clinic acute care if any continued problems.  Use ice or heat to your back as needed for discomfort.  Begin taking medication only as directed.  Flexeril 5 mg 3 times daily as needed for muscle spasms or cramps.  Norco if needed for pain every 6 hours.  Do not drive or operate machinery while taking both the muscle relaxant and the pain medication.  Zofran is every 8 hours if needed for nausea.

## 2018-11-07 ENCOUNTER — Other Ambulatory Visit: Payer: Self-pay

## 2018-11-07 ENCOUNTER — Emergency Department
Admission: EM | Admit: 2018-11-07 | Discharge: 2018-11-07 | Disposition: A | Discharge status: Home / Self Care | Payer: BLUE CROSS/BLUE SHIELD | Attending: Emergency Medicine | Admitting: Emergency Medicine

## 2018-11-07 ENCOUNTER — Emergency Department: Payer: BLUE CROSS/BLUE SHIELD

## 2018-11-07 ENCOUNTER — Encounter: Payer: Self-pay | Admitting: Emergency Medicine

## 2018-11-07 DIAGNOSIS — E119 Type 2 diabetes mellitus without complications: Secondary | ICD-10-CM | POA: Insufficient documentation

## 2018-11-07 DIAGNOSIS — K5900 Constipation, unspecified: Secondary | ICD-10-CM | POA: Insufficient documentation

## 2018-11-07 DIAGNOSIS — F17210 Nicotine dependence, cigarettes, uncomplicated: Secondary | ICD-10-CM | POA: Diagnosis not present

## 2018-11-07 LAB — GLUCOSE, CAPILLARY: GLUCOSE-CAPILLARY: 192 mg/dL — AB (ref 70–99)

## 2018-11-07 MED ORDER — POLYETHYLENE GLYCOL 3350 17 G PO PACK: 17.0000 g | PACK | Freq: Every day | ORAL | 0 refills | Status: DC

## 2018-11-07 NOTE — ED Notes (Signed)
Patient reports significant stool and relief.

## 2018-11-07 NOTE — ED Notes (Signed)
First Nurse Note: Patient states she was seen here yesterday for back pain, returns today with same complaint.  Also states she is having trouble going to bathroom - bowels.  Given WC.

## 2018-11-07 NOTE — ED Provider Notes (Signed)
[facility] Emergency Department Provider Note       Time seen: ----------------------------------------- 8:03 AM on 11/07/2018 -----------------------------------------   I have reviewed the triage vital signs and the nursing notes.  HISTORY   Chief Complaint Constipation    HPI Joanna [name] is a 53 y.o. female with a history of diabetes who presents to the ED for constipation for the past week.  Patient states she tried Dulcolax and milk of magnesia without any relief.  She denies fevers, chills or other complaints.  Past Medical History:  Diagnosis Date  . Diabetes mellitus without complication (HCC)     There are no active problems to display for this patient.   History reviewed. No pertinent surgical history.  Allergies Patient has no known allergies.  Social History Social History   Tobacco Use  . Smoking status: Current Every Day Smoker    Packs/day: 1.00    Types: Cigarettes  . Smokeless tobacco: Never Used  Substance Use Topics  . Alcohol use: Yes    Comment: occ  . Drug use: Never    Review of Systems Constitutional: Negative for fever. Cardiovascular: Negative for chest pain. Respiratory: Negative for shortness of breath. Gastrointestinal: Positive for abdominal pain, constipation Musculoskeletal: Negative for back pain. Skin: Negative for rash. Neurological: Negative for headaches, focal weakness or numbness.  All systems negative/normal/unremarkable except as stated in the HPI  ____________________________________________   PHYSICAL EXAM:  VITAL SIGNS: ED Triage Vitals [11/07/18 0756]  Enc Vitals Group     BP (!) 168/76     Pulse Rate 63     Resp 16     Temp 97.9 F (36.6 C)     Temp Source Oral     SpO2 98 %     Weight 161 lb 13.1 oz (73.4 kg)     Height 5\' 7"  (1.702 m)     Head Circumference      Peak Flow      Pain Score 10     Pain Loc      Pain Edu?      Excl. in GC?    Constitutional:  Alert and oriented. Well appearing and in no distress. Cardiovascular: Normal rate, regular rhythm. No murmurs, rubs, or gallops. Respiratory: Normal respiratory effort without tachypnea nor retractions. Breath sounds are clear and equal bilaterally. No wheezes/rales/rhonchi. Gastrointestinal: Soft and nontender. Normal bowel sounds Musculoskeletal: Nontender with normal range of motion in extremities. No lower extremity tenderness nor edema. Neurologic:  Normal speech and language. No gross focal neurologic deficits are appreciated.  Skin:  Skin is warm, dry and intact. No rash noted. Psychiatric: Mood and affect are normal. Speech and behavior are normal.  ____________________________________________  ED COURSE:  As part of my medical decision making, I reviewed the following data within the electronic MEDICAL RECORD NUMBER History obtained from family if available, nursing notes, old chart and ekg, as well as notes from prior ED visits. Patient presented for constipation, we will assess with labs and imaging as indicated at this time.   Procedures ____________________________________________   RADIOLOGY Images were viewed by me  Abdomen 2 view IMPRESSION: Mild constipation. Lumbar spine x-ray IMPRESSION: Degenerative facet disease. No acute bony abnormality.  ____________________________________________   DIFFERENTIAL DIAGNOSIS   Constipation, dehydration  FINAL ASSESSMENT AND PLAN  Constipation   Plan: The patient had presented for obstipation for the past week.  Blood sugar was not unreasonably elevated here.  Patient's imaging revealed mild constipation and mild degenerative disc  disease but no other acute abnormality.  She was given an enema here with good results.  She is cleared for outpatient follow-up.   Ulice Dash, MD    Note: This note was generated in part or whole with voice recognition software. Voice recognition is usually quite accurate but there  are transcription errors that can and very often do occur. I apologize for any typographical errors that were not detected and corrected.     Emily Filbert, MD 11/07/18 [phone]

## 2018-11-07 NOTE — ED Triage Notes (Signed)
Says constipation for 1 week.  Has tried dulcolax and milk of mag without releif

## 2018-11-08 ENCOUNTER — Emergency Department
Admission: EM | Admit: 2018-11-08 | Discharge: 2018-11-08 | Disposition: A | Discharge status: Home / Self Care | Payer: BLUE CROSS/BLUE SHIELD | Source: Home / Self Care | Attending: Emergency Medicine | Admitting: Emergency Medicine

## 2018-11-08 ENCOUNTER — Emergency Department: Payer: BLUE CROSS/BLUE SHIELD

## 2018-11-08 ENCOUNTER — Encounter: Payer: Self-pay | Admitting: Emergency Medicine

## 2018-11-08 ENCOUNTER — Emergency Department
Admission: EM | Admit: 2018-11-08 | Discharge: 2018-11-08 | Disposition: A | Discharge status: Home / Self Care | Payer: BLUE CROSS/BLUE SHIELD | Attending: Emergency Medicine | Admitting: Emergency Medicine

## 2018-11-08 ENCOUNTER — Other Ambulatory Visit: Payer: Self-pay

## 2018-11-08 DIAGNOSIS — Z79899 Other long term (current) drug therapy: Secondary | ICD-10-CM | POA: Diagnosis not present

## 2018-11-08 DIAGNOSIS — Z5321 Procedure and treatment not carried out due to patient leaving prior to being seen by health care provider: Secondary | ICD-10-CM | POA: Insufficient documentation

## 2018-11-08 DIAGNOSIS — F17210 Nicotine dependence, cigarettes, uncomplicated: Secondary | ICD-10-CM

## 2018-11-08 DIAGNOSIS — E119 Type 2 diabetes mellitus without complications: Secondary | ICD-10-CM

## 2018-11-08 DIAGNOSIS — K5900 Constipation, unspecified: Secondary | ICD-10-CM | POA: Diagnosis present

## 2018-11-08 DIAGNOSIS — M545 Low back pain, unspecified: Secondary | ICD-10-CM

## 2018-11-08 DIAGNOSIS — Z7984 Long term (current) use of oral hypoglycemic drugs: Secondary | ICD-10-CM | POA: Insufficient documentation

## 2018-11-08 DIAGNOSIS — K5901 Slow transit constipation: Secondary | ICD-10-CM | POA: Insufficient documentation

## 2018-11-08 LAB — CBC WITH DIFFERENTIAL/PLATELET
ABS IMMATURE GRANULOCYTES: 0.02 10*3/uL (ref 0.00–0.07)
Basophils Absolute: 0 10*3/uL (ref 0.0–0.1)
Basophils Relative: 0 %
Eosinophils Absolute: 0.1 10*3/uL (ref 0.0–0.5)
Eosinophils Relative: 1 %
HCT: 40.8 % (ref 36.0–46.0)
Hemoglobin: 13 g/dL (ref 12.0–15.0)
IMMATURE GRANULOCYTES: 0 %
Lymphocytes Relative: 30 %
Lymphs Abs: 2.3 10*3/uL (ref 0.7–4.0)
MCH: 25.3 pg — ABNORMAL LOW (ref 26.0–34.0)
MCHC: 31.9 g/dL (ref 30.0–36.0)
MCV: 79.5 fL — ABNORMAL LOW (ref 80.0–100.0)
Monocytes Absolute: 0.5 10*3/uL (ref 0.1–1.0)
Monocytes Relative: 6 %
NEUTROS ABS: 4.8 10*3/uL (ref 1.7–7.7)
NEUTROS PCT: 63 %
NRBC: 0 % (ref 0.0–0.2)
Platelets: 358 10*3/uL (ref 150–400)
RBC: 5.13 MIL/uL — ABNORMAL HIGH (ref 3.87–5.11)
RDW: 15 % (ref 11.5–15.5)
WBC: 7.7 10*3/uL (ref 4.0–10.5)

## 2018-11-08 LAB — URINALYSIS, COMPLETE (UACMP) WITH MICROSCOPIC
Bacteria, UA: NONE SEEN
Bilirubin Urine: NEGATIVE
Glucose, UA: NEGATIVE mg/dL
Ketones, ur: NEGATIVE mg/dL
Leukocytes,Ua: NEGATIVE
Nitrite: NEGATIVE
Protein, ur: 100 mg/dL — AB
Specific Gravity, Urine: 1.015 (ref 1.005–1.030)
pH: 6 (ref 5.0–8.0)

## 2018-11-08 LAB — COMPREHENSIVE METABOLIC PANEL
ALT: 14 U/L (ref 0–44)
AST: 17 U/L (ref 15–41)
Albumin: 4.7 g/dL (ref 3.5–5.0)
Alkaline Phosphatase: 73 U/L (ref 38–126)
Anion gap: 12 (ref 5–15)
BUN: 12 mg/dL (ref 6–20)
CO2: 25 mmol/L (ref 22–32)
Calcium: 10.2 mg/dL (ref 8.9–10.3)
Chloride: 102 mmol/L (ref 98–111)
Creatinine, Ser: 0.72 mg/dL (ref 0.44–1.00)
GFR calc Af Amer: >60 mL/min (ref >60)
GFR calc non Af Amer: >60 mL/min (ref >60)
GLUCOSE: 166 mg/dL — AB (ref 70–99)
Potassium: 4.2 mmol/L (ref 3.5–5.1)
SODIUM: 139 mmol/L (ref 135–145)
Total Bilirubin: 0.8 mg/dL (ref 0.3–1.2)
Total Protein: 8.7 g/dL — ABNORMAL HIGH (ref 6.5–8.1)

## 2018-11-08 LAB — LIPASE, BLOOD: Lipase: 25 U/L (ref 11–51)

## 2018-11-08 MED ORDER — NAPROXEN 500 MG PO TABS: 500.0000 mg | ORAL_TABLET | Freq: Two times a day (BID) | ORAL | 2 refills | Status: DC

## 2018-11-08 MED ORDER — KETOROLAC TROMETHAMINE 30 MG/ML IJ SOLN
30.0000 mg | Freq: Once | INTRAMUSCULAR | Status: AC
  Administered 2018-11-08: 30 mg via INTRAMUSCULAR
  Filled 2018-11-08: qty 1

## 2018-11-08 MED ORDER — DOCUSATE SODIUM 100 MG PO CAPS: 100.0000 mg | ORAL_CAPSULE | Freq: Two times a day (BID) | ORAL | 0 refills | Status: AC

## 2018-11-08 MED ORDER — ONDANSETRON HCL 4 MG/2ML IJ SOLN
4.0000 mg | Freq: Once | INTRAMUSCULAR | Status: AC
  Administered 2018-11-08: 4 mg via INTRAVENOUS
  Filled 2018-11-08: qty 2

## 2018-11-08 MED ORDER — MORPHINE SULFATE (PF) 4 MG/ML IV SOLN
4.0000 mg | Freq: Once | INTRAVENOUS | Status: AC
  Administered 2018-11-08: 4 mg via INTRAVENOUS
  Filled 2018-11-08: qty 1

## 2018-11-08 NOTE — ED Triage Notes (Addendum)
Here yesterday for constipation. Came back early this AM for same thing and left without being seen because wait was too long and is back for same thing.  KUB was done this AM showing constipation that has improved but is still present.  Pt has not been taking the miralax that she was told to use yesterday when discharged. No protocols done at this time.

## 2018-11-08 NOTE — ED Notes (Signed)

## 2018-11-08 NOTE — ED Provider Notes (Signed)
[facility] Emergency Department Provider Note   ____________________________________________    I have reviewed the triage vital signs and the nursing notes.   HISTORY  Chief Complaint Constipation    HPI Joanna [name] is a 53 y.o. female with a history of diabetes who presents with complaints of constipation/right lower back pain.  Patient was seen in the emergency department yesterday given an enema with improvement in stooling but she states that she continues to have pain today in her flank.  Denies nausea or vomiting.  No fevers or chills.  Does not take anything outside of the ED for this  Past Medical History:  Diagnosis Date  . Diabetes mellitus without complication (HCC)     There are no active problems to display for this patient.   History reviewed. No pertinent surgical history.  Prior to Admission medications   Medication Sig Start Date End Date Taking? Authorizing Provider  albuterol (PROVENTIL HFA;VENTOLIN HFA) 108 (90 Base) MCG/ACT inhaler Inhale 2-4 puffs by mouth every 4 hours as needed for wheezing, cough, and/or shortness of breath 05/29/16   Loleta RoseForbach, Cory, MD  aspirin EC 81 MG tablet Take 81 mg by mouth daily. 12/29/14   [provider]  atorvastatin (LIPITOR) 40 MG tablet Take 40 mg by mouth daily. 05/04/18   [provider]  buPROPion (WELLBUTRIN SR) 150 MG 12 hr tablet Take 150 mg by mouth daily. 01/20/15   [provider]  cyclobenzaprine (FLEXERIL) 5 MG tablet Take 1 tablet (5 mg total) by mouth 3 (three) times daily as needed for muscle spasms. 11/06/18   Tommi RumpsSummers, Rhonda L, PA-C  docusate sodium (COLACE) 100 MG capsule Take 1 capsule (100 mg total) by mouth 2 (two) times daily for 30 days. 11/08/18 12/08/18  Jene EveryKinner, Justin Buechner, MD  famotidine (PEPCID) 20 MG tablet Take 1 tablet (20 mg total) by mouth 2 (two) times daily for 15 days. 07/22/18 08/06/18  Dionne BucySiadecki, Sebastian, MD  Ferrous Gluconate 325 (36 FE) MG  TABS Take 325 mg by mouth 2 (two) times daily. 12/29/14   [provider]  furosemide (LASIX) 20 MG tablet Take 20 mg by mouth daily. 04/27/15   [provider]  glipiZIDE (GLUCOTROL XL) 10 MG 24 hr tablet Take 10 mg by mouth daily with breakfast.    [provider]  HYDROcodone-acetaminophen (NORCO/VICODIN) 5-325 MG tablet Take 1 tablet by mouth every 6 (six) hours as needed for moderate pain. 11/06/18   Tommi RumpsSummers, Rhonda L, PA-C  lactulose (CHRONULAC) 10 GM/15ML solution Take 30 mLs (20 g total) by mouth daily as needed for mild constipation. Patient not taking: Reported on 07/22/2018 11/30/17   Irean HongSung, Jade J, MD  lisinopril (PRINIVIL,ZESTRIL) 10 MG tablet Take 10 mg by mouth daily. 07/12/15   [provider]  metFORMIN (GLUCOPHAGE-XR) 500 MG 24 hr tablet Take 2,000 mg by mouth daily. 04/27/15   [provider]  metoCLOPramide (REGLAN) 10 MG tablet Take 1 tablet (10 mg total) by mouth every 8 (eight) hours as needed for up to 5 days for nausea or vomiting. 07/22/18 07/27/18  Dionne BucySiadecki, Sebastian, MD  naproxen (NAPROSYN) 500 MG tablet Take 1 tablet (500 mg total) by mouth 2 (two) times daily with a meal. 11/08/18   Jene EveryKinner, John Vasconcelos, MD  omeprazole (PRILOSEC) 40 MG capsule Take 40 mg by mouth daily. 06/10/18   [provider]  ondansetron (ZOFRAN ODT) 4 MG disintegrating tablet Take 1 tablet (4 mg total) by mouth every 8 (eight) hours  as needed for nausea or vomiting. 11/06/18   Tommi Rumps, PA-C  polyethylene glycol (MIRALAX / GLYCOLAX) packet Take 17 g by mouth daily. 11/07/18   Emily Filbert, MD     Allergies Patient has no known allergies.  History reviewed. No pertinent family history.  Social History Social History   Tobacco Use  . Smoking status: Current Every Day Smoker    Packs/day: 1.00    Types: Cigarettes  . Smokeless tobacco: Never Used  Substance Use Topics  . Alcohol use: Yes    Comment: occ  . Drug use: Never    Review  of Systems  Constitutional: No fever/chills Eyes: No visual changes.  ENT: No sore throat. Cardiovascular: Denies chest pain. Respiratory: Denies shortness of breath. Gastrointestinal: As above Genitourinary: Negative for dysuria. Musculoskeletal: As above Skin: Negative for rash. Neurological: Negative for headaches or weakness   ____________________________________________   PHYSICAL EXAM:  VITAL SIGNS: ED Triage Vitals  Enc Vitals Group     BP 11/08/18 0850 (!) 180/69     Pulse Rate 11/08/18 0850 79     Resp 11/08/18 0850 (!) 22     Temp 11/08/18 0850 98.3 F (36.8 C)     Temp Source 11/08/18 0850 Oral     SpO2 11/08/18 0850 100 %     Weight 11/08/18 0848 73.4 kg (161 lb 13.1 oz)     Height 11/08/18 0848 1.702 m (5\' 7" )     Head Circumference --      Peak Flow --      Pain Score 11/08/18 0847 10     Pain Loc --      Pain Edu? --      Excl. in GC? --     Constitutional: Alert and oriented.  Anxious and tearful Eyes: Conjunctivae are normal.   Nose: No congestion/rhinnorhea. Mouth/Throat: Mucous membranes are moist.    Cardiovascular: Normal rate, regular rhythm. Grossly normal heart sounds.  Good peripheral circulation. Respiratory: Normal respiratory effort.  No retractions. Lungs CTAB. Gastrointestinal: Soft and nontender. No distention.    Musculoskeletal: .  Warm and well perfused Neurologic:  Normal speech and language. No gross focal neurologic deficits are appreciated.  Skin:  Skin is warm, dry and intact. No rash noted. Psychiatric: Mood and affect are normal. Speech and behavior are normal.   ____________________________________________   LABS (all labs ordered are listed, but only abnormal results are displayed)  Labs Reviewed  URINALYSIS, COMPLETE (UACMP) WITH MICROSCOPIC - Abnormal; Notable for the following components:      Result Value   Color, Urine YELLOW (*)    APPearance CLEAR (*)    Hgb urine dipstick SMALL (*)    Protein, ur 100  (*)    All other components within normal limits  CBC WITH DIFFERENTIAL/PLATELET - Abnormal; Notable for the following components:   RBC 5.13 (*)    MCV 79.5 (*)    MCH 25.3 (*)    All other components within normal limits  COMPREHENSIVE METABOLIC PANEL - Abnormal; Notable for the following components:   Glucose, Bld 166 (*)    Total Protein 8.7 (*)    All other components within normal limits  LIPASE, BLOOD   ____________________________________________  EKG  None ____________________________________________  RADIOLOGY  CT renal stone unremarkable ____________________________________________   PROCEDURES  Procedure(s) performed: No  Procedures   Critical Care performed: No ____________________________________________   INITIAL IMPRESSION / ASSESSMENT AND PLAN / ED COURSE  Pertinent labs & imaging results that  were available during my care of the patient were reviewed by me and considered in my medical decision making (see chart for details).  She presents primarily with what appears to be right lower back pain however some pain is in her flank, we will send for CT renal study treat with IM Toradol  Patient with little relief after Toradol, IV placed and IV morphine and Zofran given, labs sent which are unremarkable as is urine.  ----------------------------------------- 12:32 PM on 11/08/2018 -----------------------------------------  Patient is sleeping comfortably in her room.  Work-up is quite reassuring, appropriate for discharge at this time with outpatient follow-up    ____________________________________________   FINAL CLINICAL IMPRESSION(S) / ED DIAGNOSES  Final diagnoses:  Slow transit constipation  Acute low back pain, unspecified back pain laterality, unspecified whether sciatica present        Note:  This document was prepared using Dragon voice recognition software and may include unintentional dictation errors.   Jene Every,  MD 11/08/18 1343

## 2018-11-08 NOTE — ED Triage Notes (Signed)
Pt to triage via w/c with no distress noted; pt st seen earlier for constipation "but I don't think they got it all out"; denies any abd pain

## 2018-11-08 NOTE — ED Notes (Signed)
Pt unable to sign for discharge d/t discharge form not loading at all.

## 2018-11-08 NOTE — ED Notes (Signed)
Pt to STAT desk asking how much longer her wait might be. Pt states "I cant wait until after 8 people". Pt seen leaving lobby.

## 2018-11-14 ENCOUNTER — Encounter: Payer: Self-pay | Admitting: Emergency Medicine

## 2018-11-14 ENCOUNTER — Emergency Department: Payer: BLUE CROSS/BLUE SHIELD

## 2018-11-14 ENCOUNTER — Other Ambulatory Visit: Payer: Self-pay

## 2018-11-14 ENCOUNTER — Emergency Department
Admission: EM | Admit: 2018-11-14 | Discharge: 2018-11-14 | Discharge status: Against Medical Advice | Payer: BLUE CROSS/BLUE SHIELD | Attending: Emergency Medicine | Admitting: Emergency Medicine

## 2018-11-14 DIAGNOSIS — J3489 Other specified disorders of nose and nasal sinuses: Secondary | ICD-10-CM | POA: Diagnosis not present

## 2018-11-14 DIAGNOSIS — Z7982 Long term (current) use of aspirin: Secondary | ICD-10-CM | POA: Insufficient documentation

## 2018-11-14 DIAGNOSIS — Z79899 Other long term (current) drug therapy: Secondary | ICD-10-CM | POA: Diagnosis not present

## 2018-11-14 DIAGNOSIS — F17210 Nicotine dependence, cigarettes, uncomplicated: Secondary | ICD-10-CM | POA: Diagnosis not present

## 2018-11-14 DIAGNOSIS — R109 Unspecified abdominal pain: Secondary | ICD-10-CM | POA: Insufficient documentation

## 2018-11-14 DIAGNOSIS — E119 Type 2 diabetes mellitus without complications: Secondary | ICD-10-CM | POA: Diagnosis not present

## 2018-11-14 DIAGNOSIS — Z7984 Long term (current) use of oral hypoglycemic drugs: Secondary | ICD-10-CM | POA: Insufficient documentation

## 2018-11-14 DIAGNOSIS — Z5329 Procedure and treatment not carried out because of patient's decision for other reasons: Secondary | ICD-10-CM | POA: Diagnosis not present

## 2018-11-14 LAB — LIPASE, BLOOD: Lipase: 25 U/L (ref 11–51)

## 2018-11-14 LAB — COMPREHENSIVE METABOLIC PANEL
ALT: 11 U/L (ref 0–44)
AST: 13 U/L — ABNORMAL LOW (ref 15–41)
Albumin: 4.3 g/dL (ref 3.5–5.0)
Alkaline Phosphatase: 68 U/L (ref 38–126)
Anion gap: 11 (ref 5–15)
BUN: 18 mg/dL (ref 6–20)
CHLORIDE: 106 mmol/L (ref 98–111)
CO2: 23 mmol/L (ref 22–32)
CREATININE: 0.76 mg/dL (ref 0.44–1.00)
Calcium: 10.1 mg/dL (ref 8.9–10.3)
GFR calc Af Amer: >60 mL/min (ref >60)
GFR calc non Af Amer: >60 mL/min (ref >60)
Glucose, Bld: 152 mg/dL — ABNORMAL HIGH (ref 70–99)
Potassium: 4.5 mmol/L (ref 3.5–5.1)
Sodium: 140 mmol/L (ref 135–145)
Total Bilirubin: 0.6 mg/dL (ref 0.3–1.2)
Total Protein: 7.7 g/dL (ref 6.5–8.1)

## 2018-11-14 LAB — CBC WITH DIFFERENTIAL/PLATELET
Abs Immature Granulocytes: 0.03 10*3/uL (ref 0.00–0.07)
Basophils Absolute: 0 10*3/uL (ref 0.0–0.1)
Basophils Relative: 0 %
EOS ABS: 0.2 10*3/uL (ref 0.0–0.5)
EOS PCT: 2 %
HCT: 37.8 % (ref 36.0–46.0)
Hemoglobin: 12 g/dL (ref 12.0–15.0)
Immature Granulocytes: 0 %
Lymphocytes Relative: 22 %
Lymphs Abs: 2 10*3/uL (ref 0.7–4.0)
MCH: 26 pg (ref 26.0–34.0)
MCHC: 31.7 g/dL (ref 30.0–36.0)
MCV: 82 fL (ref 80.0–100.0)
Monocytes Absolute: 0.4 10*3/uL (ref 0.1–1.0)
Monocytes Relative: 5 %
Neutro Abs: 6.6 10*3/uL (ref 1.7–7.7)
Neutrophils Relative %: 71 %
Platelets: 321 10*3/uL (ref 150–400)
RBC: 4.61 MIL/uL (ref 3.87–5.11)
RDW: 15.1 % (ref 11.5–15.5)
WBC: 9.2 10*3/uL (ref 4.0–10.5)
nRBC: 0 % (ref 0.0–0.2)

## 2018-11-14 LAB — URINALYSIS, COMPLETE (UACMP) WITH MICROSCOPIC
Bacteria, UA: NONE SEEN
Bilirubin Urine: NEGATIVE
Glucose, UA: NEGATIVE mg/dL
Hgb urine dipstick: NEGATIVE
Ketones, ur: NEGATIVE mg/dL
Leukocytes,Ua: NEGATIVE
Nitrite: NEGATIVE
Protein, ur: NEGATIVE mg/dL
Specific Gravity, Urine: 1.018 (ref 1.005–1.030)
pH: 5 (ref 5.0–8.0)

## 2018-11-14 NOTE — ED Notes (Signed)
Called to room by pt, sitting up on stretcher and rocking back and forth, pt crying.  Notified pt that I would discuss POC with Dr. Scotty Court and return as soon as possible.  Discussed pt with Dr. Scotty Court who states that he would like to review pt's previous visits prior to new orders.

## 2018-11-14 NOTE — ED Notes (Signed)
Pts call bell ringing multiple times, Tiffany, RN to room to discuss need to review previous charts with pt.  Pt states that she wants to leave at this time.  Pt encouraged to stay for treatment as she has had multiple visits in last week.  Pt states that she wants to leave and that she needs a work note showing that she was here.  Work note declined at this time as pt did not complete treatment.  Pt asked to sign AMA form to which pt responds, "I ain't signing shit".  Pt left department ambulatory at this time.

## 2018-11-14 NOTE — ED Notes (Signed)
Pt seen walking from room toward exit.  Pt stopped by Elmarie Shiley, RN and assisted back to room to await Dr. Scotty Court at this time.

## 2018-11-14 NOTE — ED Triage Notes (Signed)
Patient ambulatory to triage with steady gait, without difficulty or distress noted; pt reports constipation x 2 days accomp by N/V and left lower back pain

## 2018-11-14 NOTE — ED Provider Notes (Signed)
[facility] Emergency Department Provider Note  ____________________________________________  Time seen: Approximately 9:00 AM  I have reviewed the triage vital signs and the nursing notes.   HISTORY  Chief Complaint Constipation    HPI Joanna [name] is a 53 y.o. female who complains of left flank pain for the past week.  She reports that she has been in the ED 3 times in the last week, had labs and CT scan which were unremarkable according to the patient.  She states the one thing that helped was getting IV morphine and she requests IV morphine today.  She denies dysuria hematuria vaginal bleeding discharge nausea vomiting or diarrhea.  Last bowel movement was 2 days ago.   Unfortunately there was epic downtime there was unanticipated from 9:00 to 10:05 AM today.  The patient left AMA just prior to epic function being restored.  During this time I was unable to review any past medical history including recent CT and lab results.    Past Medical History:  Diagnosis Date  . Diabetes mellitus without complication (HCC)      There are no active problems to display for this patient.    History reviewed. No pertinent surgical history.   Prior to Admission medications   Medication Sig Start Date End Date Taking? Authorizing Provider  albuterol (PROVENTIL HFA;VENTOLIN HFA) 108 (90 Base) MCG/ACT inhaler Inhale 2-4 puffs by mouth every 4 hours as needed for wheezing, cough, and/or shortness of breath 05/29/16   Loleta Rose, MD  aspirin EC 81 MG tablet Take 81 mg by mouth daily. 12/29/14   [provider]  atorvastatin (LIPITOR) 40 MG tablet Take 40 mg by mouth daily. 05/04/18   [provider]  buPROPion (WELLBUTRIN SR) 150 MG 12 hr tablet Take 150 mg by mouth daily. 01/20/15   [provider]  cyclobenzaprine (FLEXERIL) 5 MG tablet Take 1 tablet (5 mg total) by mouth 3 (three) times daily as needed for muscle spasms. 11/06/18   Tommi Rumps, PA-C  docusate sodium (COLACE) 100 MG capsule Take 1 capsule (100 mg total) by mouth 2 (two) times daily for 30 days. 11/08/18 12/08/18  Jene Every, MD  famotidine (PEPCID) 20 MG tablet Take 1 tablet (20 mg total) by mouth 2 (two) times daily for 15 days. 07/22/18 08/06/18  Dionne Bucy, MD  Ferrous Gluconate 325 (36 FE) MG TABS Take 325 mg by mouth 2 (two) times daily. 12/29/14   [provider]  furosemide (LASIX) 20 MG tablet Take 20 mg by mouth daily. 04/27/15   [provider]  glipiZIDE (GLUCOTROL XL) 10 MG 24 hr tablet Take 10 mg by mouth daily with breakfast.    [provider]  HYDROcodone-acetaminophen (NORCO/VICODIN) 5-325 MG tablet Take 1 tablet by mouth every 6 (six) hours as needed for moderate pain. 11/06/18   Tommi Rumps, PA-C  lactulose (CHRONULAC) 10 GM/15ML solution Take 30 mLs (20 g total) by mouth daily as needed for mild constipation. Patient not taking: Reported on 07/22/2018 11/30/17   Irean Hong, MD  lisinopril (PRINIVIL,ZESTRIL) 10 MG tablet Take 10 mg by mouth daily. 07/12/15   [provider]  metFORMIN (GLUCOPHAGE-XR) 500 MG 24 hr tablet Take 2,000 mg by mouth daily. 04/27/15   [provider]  metoCLOPramide (REGLAN) 10 MG tablet Take 1 tablet (10 mg total) by mouth every 8 (eight) hours as needed for up to 5 days for nausea or vomiting. 07/22/18 07/27/18  Dionne Bucy, MD  naproxen (NAPROSYN) 500 MG tablet Take 1 tablet (500 mg total) by mouth 2 (two) times daily with a meal. 11/08/18   Jene Every, MD  omeprazole (PRILOSEC) 40 MG capsule Take 40 mg by mouth daily. 06/10/18   [provider]  ondansetron (ZOFRAN ODT) 4 MG disintegrating tablet Take 1 tablet (4 mg total) by mouth every 8 (eight) hours as needed for nausea or vomiting. 11/06/18   Tommi Rumps, PA-C  polyethylene glycol (MIRALAX / GLYCOLAX) packet Take 17 g by mouth daily. 11/07/18   Emily Filbert, MD      Allergies Patient has no known allergies.   No family history on file.  Social History Social History   Tobacco Use  . Smoking status: Current Every Day Smoker    Packs/day: 1.00    Types: Cigarettes  . Smokeless tobacco: Never Used  Substance Use Topics  . Alcohol use: Yes    Comment: occ  . Drug use: Never    Review of Systems  Constitutional:   No fever or chills.  ENT:   No sore throat.  Positive rhinorrhea. Cardiovascular:   No chest pain or syncope. Respiratory:   No dyspnea or cough. Gastrointestinal:   Positive left flank pain without vomiting and diarrhea.  Musculoskeletal:   Negative for focal pain or swelling All other systems reviewed and are negative except as documented above in ROS and HPI.  ____________________________________________   PHYSICAL EXAM:  VITAL SIGNS: ED Triage Vitals  Enc Vitals Group     BP 11/14/18 0545 (!) 172/75     Pulse Rate 11/14/18 0545 64     Resp 11/14/18 0545 17     Temp 11/14/18 0545 97.6 F (36.4 C)     Temp Source 11/14/18 0545 Oral     SpO2 11/14/18 0545 100 %     Weight 11/14/18 0542 142 lb (64.4 kg)     Height 11/14/18 0542 5\' 7"  (1.702 m)     Head Circumference --      Peak Flow --      Pain Score 11/14/18 0541 10     Pain Loc --      Pain Edu? --      Excl. in GC? --     Vital signs reviewed, nursing assessments reviewed.   Constitutional:   Alert and oriented. Non-toxic appearance. Eyes:   Conjunctivae are normal. EOMI. PERRL. ENT      Head:   Normocephalic and atraumatic.      Nose:   No congestion/rhinnorhea.       Mouth/Throat:   MMM, no pharyngeal erythema. No peritonsillar mass.       Neck:   No meningismus. Full ROM. Hematological/Lymphatic/Immunilogical:   No cervical lymphadenopathy. Cardiovascular:   RRR. Symmetric bilateral radial and DP pulses.  No murmurs. Cap refill less than 2 seconds. Respiratory:   Normal respiratory effort without tachypnea/retractions. Breath sounds are  clear and equal bilaterally. No wheezes/rales/rhonchi. Gastrointestinal:   Soft with slight left lower quadrant tenderness.  No mass. Non distended. There is no CVA tenderness.  No rebound, rigidity, or guarding. Musculoskeletal:   Normal range of motion in all extremities. No joint effusions.  No lower extremity tenderness.  No edema. Neurologic:   Normal speech and language.  Motor grossly intact. No acute focal neurologic deficits are appreciated.  Skin:    Skin is warm, dry and intact. No rash noted.  No petechiae, purpura, or bullae.  ____________________________________________    LABS (pertinent positives/negatives) (all  labs ordered are listed, but only abnormal results are displayed) Labs Reviewed  COMPREHENSIVE METABOLIC PANEL - Abnormal; Notable for the following components:      Result Value   Glucose, Bld 152 (*)    AST 13 (*)    All other components within normal limits  URINALYSIS, COMPLETE (UACMP) WITH MICROSCOPIC - Abnormal; Notable for the following components:   Color, Urine YELLOW (*)    APPearance CLEAR (*)    All other components within normal limits  CBC WITH DIFFERENTIAL/PLATELET  LIPASE, BLOOD   ____________________________________________   EKG    ____________________________________________    RADIOLOGY  Dg Abdomen 1 View  Result Date: 11/14/2018 CLINICAL DATA:  Constipation EXAM: ABDOMEN - 1 VIEW COMPARISON:  11/08/2018 FINDINGS: Diffuse formed stool distending the colon. No rectal distention or small bowel obstruction. No worrisome mass effect or calcification. IMPRESSION: Constipation without rectal impaction or small bowel obstruction. Electronically Signed   By: Marnee Spring M.D.   On: 11/14/2018 06:07    ____________________________________________   PROCEDURES Procedures  ____________________________________________  DIFFERENTIAL DIAGNOSIS   Kidney stone, opioid withdrawal, viral syndrome, diverticulitis, ovarian cyst.  Doubt  STI PID TOA torsion.  CLINICAL IMPRESSION / ASSESSMENT AND PLAN / ED COURSE  Medications ordered in the ED: Medications - No data to display  Pertinent labs & imaging results that were available during my care of the patient were reviewed by me and considered in my medical decision making (see chart for details).    Patient arrived complaining of left flank pain reporting multiple prior recent visits for the same symptoms including extensive work-up.  Due to epic downtime I was unable to verify this to review results.  Because the patient was not in distress, overall reassuring vitals and exam, I decided to wait until recent results could be obtained to further guide her care.  Unfortunately, the patient became frustrated with the weight and decided to leave the ED AGAINST MEDICAL ADVICE before prior data were available and new treatment and work-up could be initiated.  Apparently she refused to sign any paperwork and told the nurse that "I ain't signing shit" and that she was going to [facility] to be evaluated.  She is alert and oriented, lucid, linear thought, goal-directed and rational.  Has medical decision-making capacity.      ____________________________________________   FINAL CLINICAL IMPRESSION(S) / ED DIAGNOSES    Final diagnoses:  Left flank pain     ED Discharge Orders    None      Portions of this note were generated with dragon dictation software. Dictation errors may occur despite best attempts at proofreading.   Sharman Cheek, MD 11/14/18 1013

## 2018-11-18 ENCOUNTER — Encounter: Payer: Self-pay | Admitting: *Deleted

## 2018-11-18 ENCOUNTER — Emergency Department
Admission: EM | Admit: 2018-11-18 | Discharge: 2018-11-18 | Disposition: A | Discharge status: Home / Self Care | Payer: BLUE CROSS/BLUE SHIELD | Attending: Emergency Medicine | Admitting: Emergency Medicine

## 2018-11-18 ENCOUNTER — Other Ambulatory Visit: Payer: Self-pay

## 2018-11-18 DIAGNOSIS — Z79899 Other long term (current) drug therapy: Secondary | ICD-10-CM | POA: Diagnosis not present

## 2018-11-18 DIAGNOSIS — M5432 Sciatica, left side: Secondary | ICD-10-CM | POA: Diagnosis not present

## 2018-11-18 DIAGNOSIS — Z7984 Long term (current) use of oral hypoglycemic drugs: Secondary | ICD-10-CM | POA: Insufficient documentation

## 2018-11-18 DIAGNOSIS — Z7982 Long term (current) use of aspirin: Secondary | ICD-10-CM | POA: Insufficient documentation

## 2018-11-18 DIAGNOSIS — E119 Type 2 diabetes mellitus without complications: Secondary | ICD-10-CM | POA: Insufficient documentation

## 2018-11-18 DIAGNOSIS — R1032 Left lower quadrant pain: Secondary | ICD-10-CM | POA: Diagnosis present

## 2018-11-18 DIAGNOSIS — F17210 Nicotine dependence, cigarettes, uncomplicated: Secondary | ICD-10-CM | POA: Insufficient documentation

## 2018-11-18 MED ORDER — PREDNISONE 20 MG PO TABS: 60.0000 mg | ORAL_TABLET | Freq: Every day | ORAL | 0 refills | Status: AC

## 2018-11-18 MED ORDER — GABAPENTIN 100 MG PO CAPS
100.0000 mg | ORAL_CAPSULE | Freq: Once | ORAL | Status: AC
  Administered 2018-11-18: 100 mg via ORAL
  Filled 2018-11-18 (×2): qty 1

## 2018-11-18 MED ORDER — GABAPENTIN 100 MG PO CAPS: 100.0000 mg | ORAL_CAPSULE | Freq: Three times a day (TID) | ORAL | 0 refills | Status: DC

## 2018-11-18 MED ORDER — PREDNISONE 20 MG PO TABS
60.0000 mg | ORAL_TABLET | Freq: Once | ORAL | Status: AC
  Administered 2018-11-18: 60 mg via ORAL
  Filled 2018-11-18: qty 3

## 2018-11-18 NOTE — ED Triage Notes (Signed)
Pt is here for re-evaluation of left lower back pain with pain radiating to left leg, sciatica.  Pt also reports continued constipation. LBM yesterday with suppository, pt continues to feel constipated.

## 2018-11-18 NOTE — ED Notes (Signed)
Patient reports pain in left lower back shooting down left leg.

## 2018-11-18 NOTE — ED Provider Notes (Signed)
[facility] Emergency Department Provider Note  ____________________________________________  Time seen: Approximately 9:49 AM  I have reviewed the triage vital signs and the nursing notes.   HISTORY  Chief Complaint Back Pain   HPI Joanna [name] is a 53 y.o. female with a history of diabetes who presents for evaluation of left flank/buttock pain.  Patient has had the pain for 3 weeks.  The pain is sharp, located in the L buttock region, radiating down to her left leg, constant.  Patient reports that she woke up with this pain 3 weeks ago.  Patient has been seen 5 times in the emergency room for the same pain over the last 12 days.  Initially thought to be constipation however even after receiving several enemas in different emergency room still having the pain.  Patient reports she is unable to sleep due to the severity of the pain.  She denies midline back pain, trauma, saddle anesthesia, weakness or numbness of her legs, urinary or bowel incontinence or retention.  No abdominal pain, no nausea or vomiting, no dysuria or hematuria.  Past Medical History:  Diagnosis Date  . Diabetes mellitus without complication (HCC)     There are no active problems to display for this patient.   History reviewed. No pertinent surgical history.  Prior to Admission medications   Medication Sig Start Date End Date Taking? Authorizing Provider  albuterol (PROVENTIL HFA;VENTOLIN HFA) 108 (90 Base) MCG/ACT inhaler Inhale 2-4 puffs by mouth every 4 hours as needed for wheezing, cough, and/or shortness of breath 05/29/16   Loleta RoseForbach, Cory, MD  aspirin EC 81 MG tablet Take 81 mg by mouth daily. 12/29/14   [provider]  atorvastatin (LIPITOR) 40 MG tablet Take 40 mg by mouth daily. 05/04/18   [provider]  buPROPion (WELLBUTRIN SR) 150 MG 12 hr tablet Take 150 mg by mouth daily. 01/20/15   [provider]  cyclobenzaprine (FLEXERIL) 5 MG tablet Take 1  tablet (5 mg total) by mouth 3 (three) times daily as needed for muscle spasms. 11/06/18   Tommi RumpsSummers, Rhonda L, PA-C  docusate sodium (COLACE) 100 MG capsule Take 1 capsule (100 mg total) by mouth 2 (two) times daily for 30 days. 11/08/18 12/08/18  Jene EveryKinner, Robert, MD  famotidine (PEPCID) 20 MG tablet Take 1 tablet (20 mg total) by mouth 2 (two) times daily for 15 days. 07/22/18 08/06/18  Dionne BucySiadecki, Sebastian, MD  Ferrous Gluconate 325 (36 FE) MG TABS Take 325 mg by mouth 2 (two) times daily. 12/29/14   [provider]  furosemide (LASIX) 20 MG tablet Take 20 mg by mouth daily. 04/27/15   [provider]  gabapentin (NEURONTIN) 100 MG capsule Take 1 capsule (100 mg total) by mouth 3 (three) times daily for 5 days. 11/18/18 11/23/18  Nita SickleVeronese, [place], MD  glipiZIDE (GLUCOTROL XL) 10 MG 24 hr tablet Take 10 mg by mouth daily with breakfast.    [provider]  HYDROcodone-acetaminophen (NORCO/VICODIN) 5-325 MG tablet Take 1 tablet by mouth every 6 (six) hours as needed for moderate pain. 11/06/18   Tommi RumpsSummers, Rhonda L, PA-C  lactulose (CHRONULAC) 10 GM/15ML solution Take 30 mLs (20 g total) by mouth daily as needed for mild constipation. Patient not taking: Reported on 07/22/2018 11/30/17   Irean HongSung, Jade J, MD  lisinopril (PRINIVIL,ZESTRIL) 10 MG tablet Take 10 mg by mouth daily. 07/12/15   [provider]  metFORMIN (GLUCOPHAGE-XR) 500 MG 24 hr tablet Take 2,000 mg by mouth daily.  04/27/15   [provider]  metoCLOPramide (REGLAN) 10 MG tablet Take 1 tablet (10 mg total) by mouth every 8 (eight) hours as needed for up to 5 days for nausea or vomiting. 07/22/18 07/27/18  Dionne Bucy, MD  naproxen (NAPROSYN) 500 MG tablet Take 1 tablet (500 mg total) by mouth 2 (two) times daily with a meal. 11/08/18   Jene Every, MD  omeprazole (PRILOSEC) 40 MG capsule Take 40 mg by mouth daily. 06/10/18   [provider]  ondansetron (ZOFRAN ODT) 4 MG disintegrating tablet Take  1 tablet (4 mg total) by mouth every 8 (eight) hours as needed for nausea or vomiting. 11/06/18   Tommi Rumps, PA-C  polyethylene glycol (MIRALAX / GLYCOLAX) packet Take 17 g by mouth daily. 11/07/18   Emily Filbert, MD  predniSONE (DELTASONE) 20 MG tablet Take 3 tablets (60 mg total) by mouth daily for 4 days. 11/18/18 11/22/18  Nita Sickle, MD    Allergies Patient has no known allergies.  No family history on file.  Social History Social History   Tobacco Use  . Smoking status: Current Every Day Smoker    Packs/day: 1.00    Types: Cigarettes  . Smokeless tobacco: Never Used  Substance Use Topics  . Alcohol use: Yes    Comment: occ  . Drug use: Never    Review of Systems  Constitutional: Negative for fever. Eyes: Negative for visual changes. ENT: Negative for sore throat. Neck: No neck pain  Cardiovascular: Negative for chest pain. Respiratory: Negative for shortness of breath. Gastrointestinal: Negative for abdominal pain, vomiting or diarrhea. Genitourinary: Negative for dysuria. Musculoskeletal: Negative for back pain. + L buttock pain Skin: Negative for rash. Neurological: Negative for headaches, weakness or numbness. Psych: No SI or HI  ____________________________________________   PHYSICAL EXAM:  VITAL SIGNS: ED Triage Vitals  Enc Vitals Group     BP 11/18/18 0918 (!) 160/89     Pulse Rate 11/18/18 0918 67     Resp 11/18/18 0918 17     Temp 11/18/18 0918 97.7 F (36.5 C)     Temp Source 11/18/18 0918 Oral     SpO2 11/18/18 0918 100 %     Weight 11/18/18 0919 142 lb (64.4 kg)     Height --      Head Circumference --      Peak Flow --      Pain Score 11/18/18 0919 10     Pain Loc --      Pain Edu? --      Excl. in GC? --     Constitutional: Alert and oriented. Well appearing and in no apparent distress. HEENT:      Head: Normocephalic and atraumatic.         Eyes: Conjunctivae are normal. Sclera is non-icteric.        Mouth/Throat: Mucous membranes are moist.       Neck: Supple with no signs of meningismus. Cardiovascular: Regular rate and rhythm. No murmurs, gallops, or rubs. 2+ symmetrical distal pulses are present in all extremities. No JVD. Respiratory: Normal respiratory effort. Lungs are clear to auscultation bilaterally. No wheezes, crackles, or rhonchi.  Gastrointestinal: Soft, non tender, and non distended with positive bowel sounds. No rebound or guarding. Genitourinary: No CVA tenderness. Musculoskeletal: Nontender with normal range of motion in all extremities. No edema, cyanosis, or erythema of extremities.  No CT and L-spine tenderness.  Patient is tender to palpation over the left sciatic notch.  Neurologic:  Normal speech and language. Face is symmetric. Moving all extremities. DTRs 2+ b/l patella.  Intact strength and sensation of both legs.  No gross focal neurologic deficits are appreciated. Skin: Skin is warm, dry and intact. No rash noted. Psychiatric: Mood and affect are normal. Speech and behavior are normal.  ____________________________________________   LABS (all labs ordered are listed, but only abnormal results are displayed)  Labs Reviewed - No data to display ____________________________________________  EKG  none  ____________________________________________  RADIOLOGY  none  ____________________________________________   PROCEDURES  Procedure(s) performed: None Procedures Critical Care performed:  None ____________________________________________   INITIAL IMPRESSION / ASSESSMENT AND PLAN / ED COURSE   53 y.o. female with a history of diabetes who presents for evaluation of left flank/buttock pain.  Patient's presentation is concerning for sciatica with sharp pain originating the left buttock region radiating down to her leg.  No midline tenderness, no signs or symptoms of cauda equina.  No trauma.  No history of cancer.  No unintentional weight loss.  Patient  is completely neurologically intact.  Patient has had several imaging studies including x-rays and CT scans, labs and urinalysis with no acute findings.  No indication to repeat imaging or labs at this time.  Will give a short prescription of gabapentin and prednisone.  Recommended checking her sugars twice a day at home for close monitoring.  Will refer patient to orthopedics.  Discussed and return precautions.      As part of my medical decision making, I reviewed the following data within the electronic MEDICAL RECORD NUMBER Nursing notes reviewed and incorporated, Old chart reviewed, Notes from prior ED visits and [place] Controlled Substance Database    Pertinent labs & imaging results that were available during my care of the patient were reviewed by me and considered in my medical decision making (see chart for details).    ____________________________________________   FINAL CLINICAL IMPRESSION(S) / ED DIAGNOSES  Final diagnoses:  Sciatica of left side      NEW MEDICATIONS STARTED DURING THIS VISIT:  ED Discharge Orders         Ordered    gabapentin (NEURONTIN) 100 MG capsule  3 times daily     11/18/18 0949    predniSONE (DELTASONE) 20 MG tablet  Daily     11/18/18 0949           Note:  This document was prepared using Dragon voice recognition software and may include unintentional dictation errors.    Nita Sickle, MD 11/18/18 [phone]

## 2018-11-24 ENCOUNTER — Encounter: Payer: Self-pay | Admitting: Emergency Medicine

## 2018-11-24 ENCOUNTER — Emergency Department
Admission: EM | Admit: 2018-11-24 | Discharge: 2018-11-24 | Disposition: A | Discharge status: Home / Self Care | Payer: BLUE CROSS/BLUE SHIELD | Attending: Emergency Medicine | Admitting: Emergency Medicine

## 2018-11-24 ENCOUNTER — Other Ambulatory Visit: Payer: Self-pay

## 2018-11-24 DIAGNOSIS — G8929 Other chronic pain: Secondary | ICD-10-CM | POA: Insufficient documentation

## 2018-11-24 DIAGNOSIS — Z7982 Long term (current) use of aspirin: Secondary | ICD-10-CM | POA: Insufficient documentation

## 2018-11-24 DIAGNOSIS — F17210 Nicotine dependence, cigarettes, uncomplicated: Secondary | ICD-10-CM | POA: Insufficient documentation

## 2018-11-24 DIAGNOSIS — M5442 Lumbago with sciatica, left side: Secondary | ICD-10-CM | POA: Diagnosis present

## 2018-11-24 DIAGNOSIS — E119 Type 2 diabetes mellitus without complications: Secondary | ICD-10-CM | POA: Diagnosis not present

## 2018-11-24 LAB — GLUCOSE, CAPILLARY: Glucose-Capillary: 113 mg/dL — ABNORMAL HIGH (ref 70–99)

## 2018-11-24 MED ORDER — DEXAMETHASONE SODIUM PHOSPHATE 10 MG/ML IJ SOLN
10.0000 mg | Freq: Once | INTRAMUSCULAR | Status: AC
  Administered 2018-11-24: 10 mg via INTRAMUSCULAR
  Filled 2018-11-24: qty 1

## 2018-11-24 MED ORDER — GABAPENTIN 100 MG PO CAPS: 100.0000 mg | ORAL_CAPSULE | Freq: Three times a day (TID) | ORAL | 0 refills | Status: DC

## 2018-11-24 NOTE — Discharge Instructions (Signed)
You will need to call your primary care provider at [facility] for evaluation or referral to an orthopedist.  The orthopedist on-call in [place] is listed on your discharge papers.  You may call tomorrow to make an appointment to the contact information provided for you.  Continue taking Neurontin 3 times a day.

## 2018-11-24 NOTE — ED Triage Notes (Signed)
Pt here for left lower back pain. Dx with sciatic on multiple visits in past. Pt reports finished her meds she was given at last visit yesterday and then her pains started back after finished meds.  Worse with any movement. Has no FU with ortho as recommended on previous visit AVS.  Ambulatory without difficulty.

## 2018-11-24 NOTE — ED Provider Notes (Signed)
[facility] Emergency Department Provider Note   ____________________________________________   First MD Initiated Contact with Patient 11/24/18 1015     (approximate)  I have reviewed the triage vital signs and the nursing notes.   HISTORY  Chief Complaint  Back Pain   HPI Joanna [name] is a 53 y.o. female presents to the ED with complaint of left lower back pain with left leg radiculopathy as she has had for nearly 1 year.  Patient was seen in the ED 6 days ago at which time she was given a prednisone and gabapentin prescription.  She did not call and make an appointment with the orthopedist as she states that the prednisone alleviated her pain.  She denies any new symptoms of her chronic back pain.  No urinary symptoms.  She rates her pain as a 10/10.     Past Medical History:  Diagnosis Date  . Diabetes mellitus without complication (HCC)     There are no active problems to display for this patient.   History reviewed. No pertinent surgical history.  Prior to Admission medications   Medication Sig Start Date End Date Taking? Authorizing Provider  albuterol (PROVENTIL HFA;VENTOLIN HFA) 108 (90 Base) MCG/ACT inhaler Inhale 2-4 puffs by mouth every 4 hours as needed for wheezing, cough, and/or shortness of breath 05/29/16   Loleta Rose, MD  aspirin EC 81 MG tablet Take 81 mg by mouth daily. 12/29/14   [provider]  atorvastatin (LIPITOR) 40 MG tablet Take 40 mg by mouth daily. 05/04/18   [provider]  buPROPion (WELLBUTRIN SR) 150 MG 12 hr tablet Take 150 mg by mouth daily. 01/20/15   [provider]  docusate sodium (COLACE) 100 MG capsule Take 1 capsule (100 mg total) by mouth 2 (two) times daily for 30 days. 11/08/18 12/08/18  Jene Every, MD  famotidine (PEPCID) 20 MG tablet Take 1 tablet (20 mg total) by mouth 2 (two) times daily for 15 days. 07/22/18 08/06/18  Dionne Bucy, MD  Ferrous Gluconate 325 (36 FE)  MG TABS Take 325 mg by mouth 2 (two) times daily. 12/29/14   [provider]  furosemide (LASIX) 20 MG tablet Take 20 mg by mouth daily. 04/27/15   [provider]  gabapentin (NEURONTIN) 100 MG capsule Take 1 capsule (100 mg total) by mouth 3 (three) times daily for 10 days. 11/24/18 12/04/18  Tommi Rumps, PA-C  glipiZIDE (GLUCOTROL XL) 10 MG 24 hr tablet Take 10 mg by mouth daily with breakfast.    [provider]  lactulose (CHRONULAC) 10 GM/15ML solution Take 30 mLs (20 g total) by mouth daily as needed for mild constipation. Patient not taking: Reported on 07/22/2018 11/30/17   Irean Hong, MD  lisinopril (PRINIVIL,ZESTRIL) 10 MG tablet Take 10 mg by mouth daily. 07/12/15   [provider]  metFORMIN (GLUCOPHAGE-XR) 500 MG 24 hr tablet Take 2,000 mg by mouth daily. 04/27/15   [provider]  metoCLOPramide (REGLAN) 10 MG tablet Take 1 tablet (10 mg total) by mouth every 8 (eight) hours as needed for up to 5 days for nausea or vomiting. 07/22/18 07/27/18  Dionne Bucy, MD  omeprazole (PRILOSEC) 40 MG capsule Take 40 mg by mouth daily. 06/10/18   [provider]  polyethylene glycol (MIRALAX / GLYCOLAX) packet Take 17 g by mouth daily. 11/07/18   Emily Filbert, MD    Allergies Patient has no known allergies.  History reviewed. No pertinent family history.  Social History Social History   Tobacco Use  . Smoking status: Current Every Day Smoker    Packs/day: 1.00    Types: Cigarettes  . Smokeless tobacco: Never Used  Substance Use Topics  . Alcohol use: Yes    Comment: occ  . Drug use: Never    Review of Systems Constitutional: No fever/chills Cardiovascular: Denies chest pain. Respiratory: Denies shortness of breath. Gastrointestinal: No abdominal pain.  No nausea, no vomiting.  No diarrhea.  Genitourinary: Negative for dysuria. Musculoskeletal: Positive for chronic back pain with left leg radiculopathy. Skin:  Negative for rash. Neurological: Negative for headaches, focal weakness or numbness. ___________________________________________   PHYSICAL EXAM:  VITAL SIGNS: ED Triage Vitals  Enc Vitals Group     BP 11/24/18 1003 (!) 163/74     Pulse Rate 11/24/18 1003 68     Resp 11/24/18 1003 20     Temp 11/24/18 1003 98.9 F (37.2 C)     Temp Source 11/24/18 1003 Oral     SpO2 11/24/18 1003 99 %     Weight 11/24/18 0958 142 lb (64.4 kg)     Height 11/24/18 0958 5\' 7"  (1.702 m)     Head Circumference --      Peak Flow --      Pain Score 11/24/18 0958 10     Pain Loc --      Pain Edu? --      Excl. in GC? --    Constitutional: Alert and oriented. Well appearing and in no acute distress. Eyes: Conjunctivae are normal.  Head: Atraumatic. Neck: No stridor.   Cardiovascular: Normal rate, regular rhythm. Grossly normal heart sounds.  Good peripheral circulation. Respiratory: Normal respiratory effort.  No retractions. Lungs CTAB. Gastrointestinal: Soft and nontender. No distention. Musculoskeletal: On examination of the back there is no gross deformity and there is some minimal tenderness noted on palpation of L5-S1 area without step-offs.  Range of motion is slow and guarded.  No active muscle spasms were seen.  Straight leg raises were negative.  Muscle strength bilaterally and patient is ambulatory without any assistance. Neurologic:  Normal speech and language. No gross focal neurologic deficits are appreciated.  Reflexes were 2+ bilaterally.  No gait instability. Skin:  Skin is warm, dry and intact. No rash noted. Psychiatric: Mood and affect are normal. Speech and behavior are normal.  ____________________________________________   LABS (all labs ordered are listed, but only abnormal results are displayed)  Labs Reviewed  GLUCOSE, CAPILLARY - Abnormal; Notable for the following components:      Result Value   Glucose-Capillary 113 (*)    All other components within normal limits   CBG MONITORING, ED    PROCEDURES  Procedure(s) performed (including Critical Care):  Procedures   ____________________________________________   INITIAL IMPRESSION / ASSESSMENT AND PLAN / ED COURSE  As part of my medical decision making, I reviewed the following data within the electronic MEDICAL RECORD NUMBER Notes from prior ED visits and [place] Controlled Substance Database  Patient presents to the ED with complaint of left lower back pain with left leg sciatica.  In looking through her records this is been going on for more than 1 year.  She was seen in the ED approximately 6 days ago at which time she was prescribed prednisone and gabapentin.  She states that while she was on the prednisone she was pain-free.  She is since that time ran out of medication.  Patient was told follow-up with her PCP or  make an appointment with the orthopedist listed on her discharge papers.  Patient was given Decadron 10 mg IM after her glucose fingerstick was 113.  Patient just recently finished a prednisone prescription and it was explained to her because of her diabetes it was not necessary for her to start back on another prednisone pack.  Patient was also given prescription for gabapentin 100 mg 1 tablet 3 times daily for the next 10 days.  She is strongly encouraged to follow-up with orthopedics.  ____________________________________________   FINAL CLINICAL IMPRESSION(S) / ED DIAGNOSES  Final diagnoses:  Chronic left-sided low back pain with left-sided sciatica     ED Discharge Orders         Ordered    gabapentin (NEURONTIN) 100 MG capsule  3 times daily     11/24/18 1056           Note:  This document was prepared using Dragon voice recognition software and may include unintentional dictation errors.    Tommi Rumps, PA-C 11/24/18 1512    Nita Sickle, MD 11/25/18 [phone]

## 2019-01-11 IMAGING — US US ABDOMEN LIMITED
1 series · 14 of 25 positions shown · non-contrast
Comparison: None.

CLINICAL DATA: 51-year-old female with epigastric pain.

EXAM:
ULTRASOUND ABDOMEN LIMITED RIGHT UPPER QUADRANT

[Series 1: us abdomen limited · 0.25mm/px · 14 of 47 slices shown]
[im 1/47]
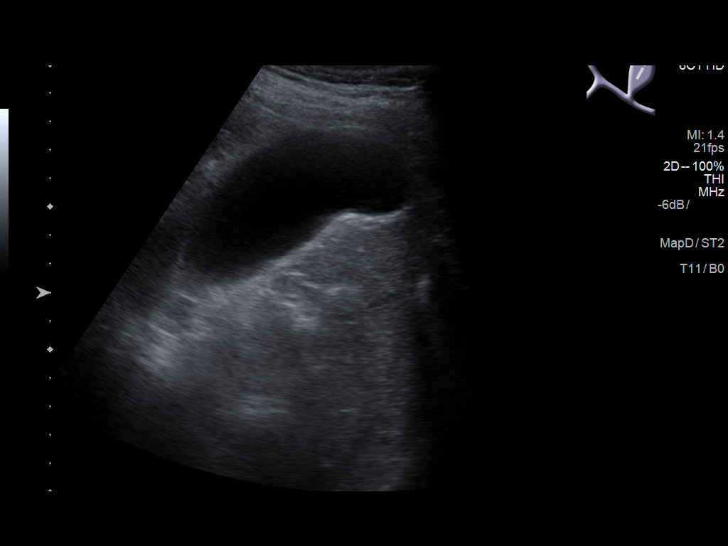
[im 4/47]
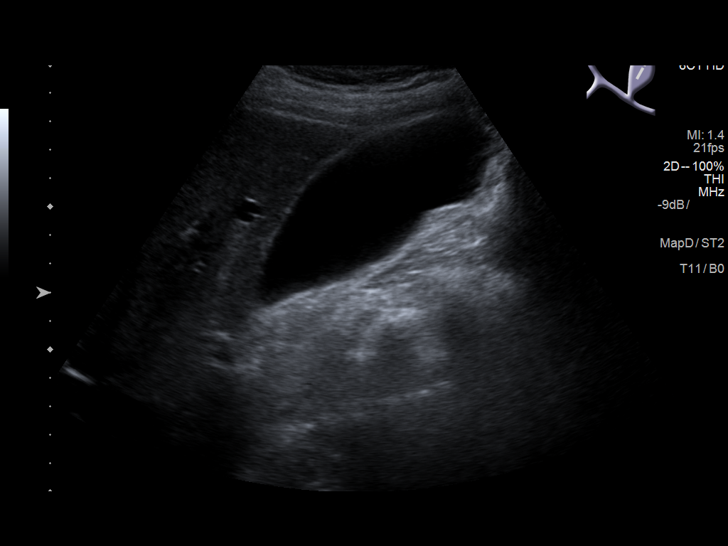
[im 8/47]
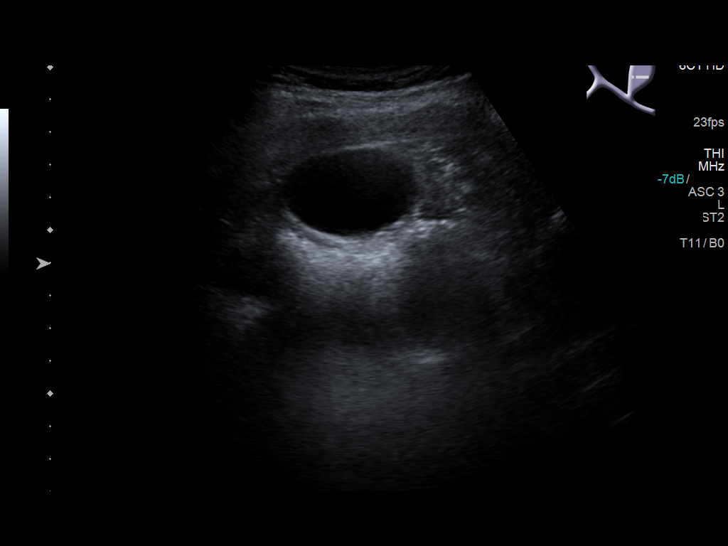
[im 12/47]
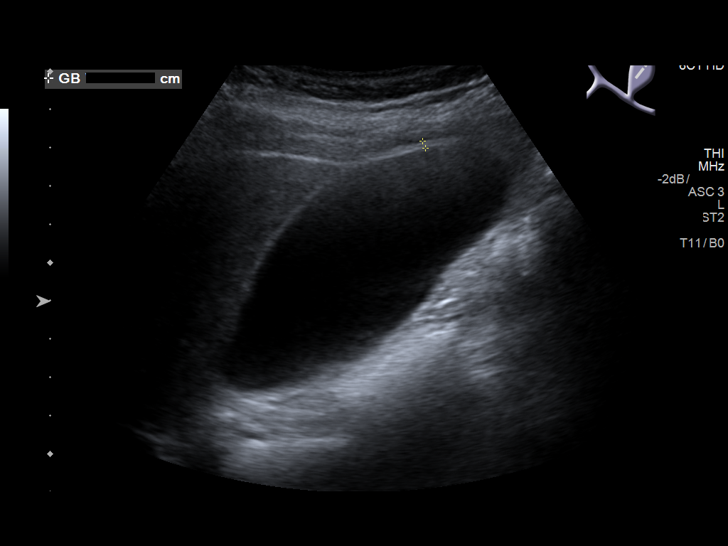
[im 16/47]
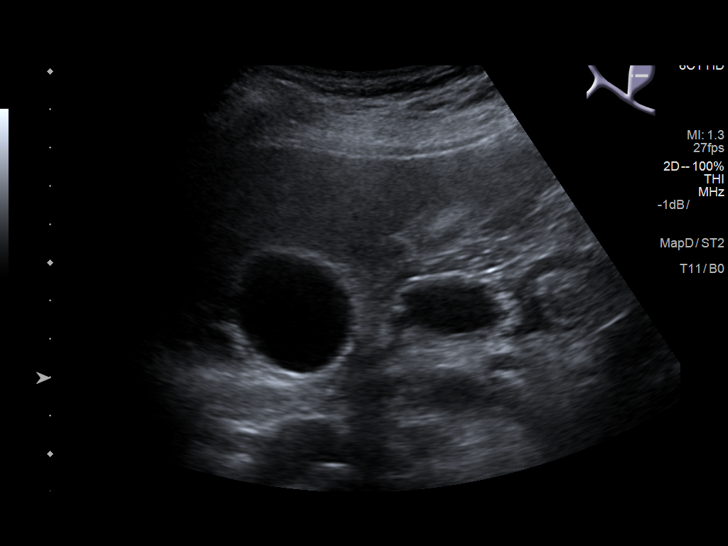
[im 18/47]
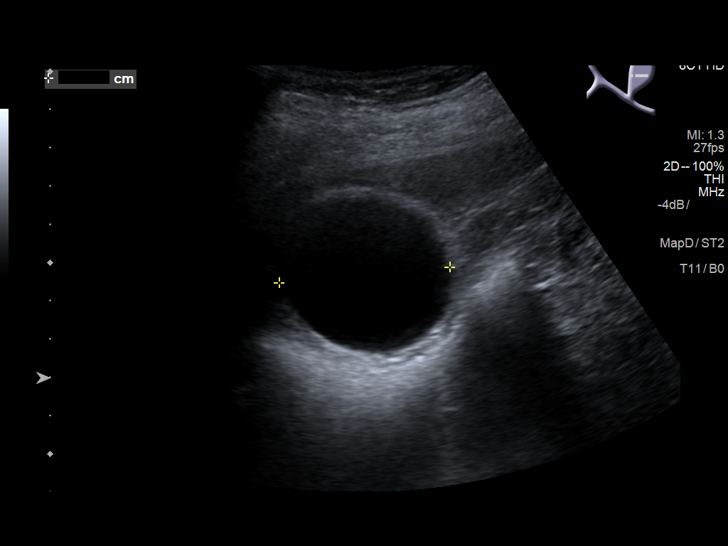
[im 22/47]
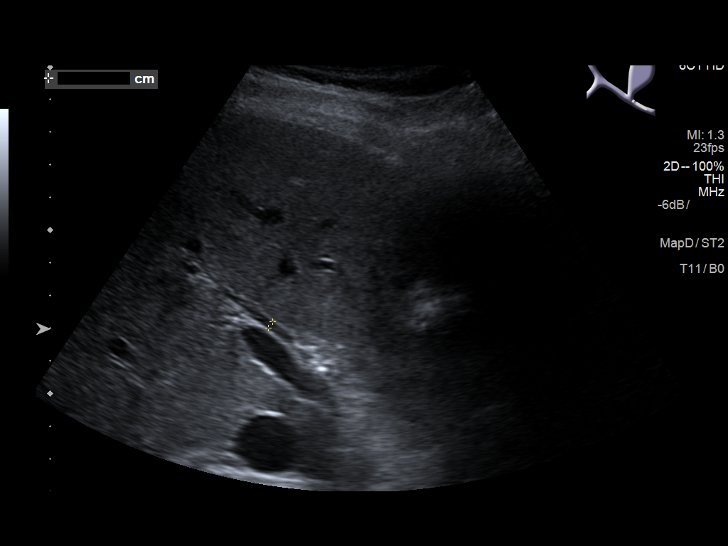
[im 25/47]
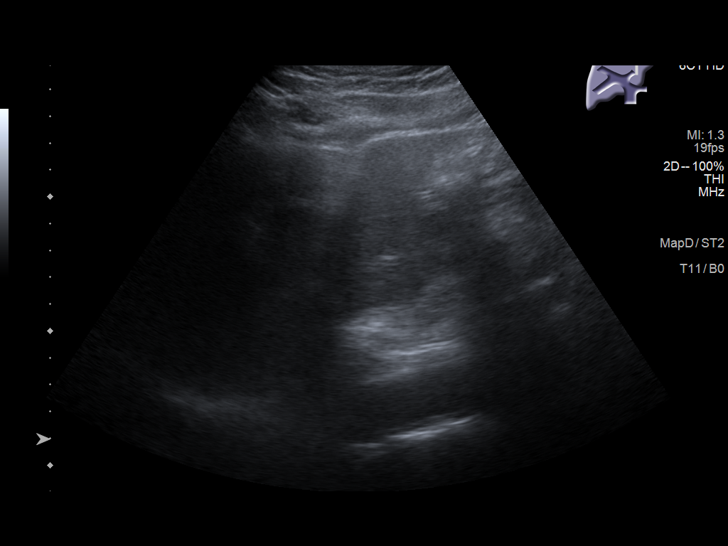
[im 29/47]
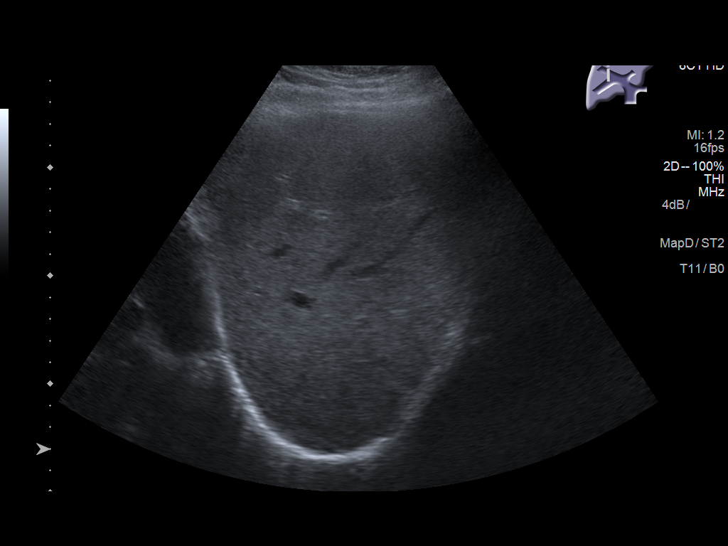
[im 31/47]
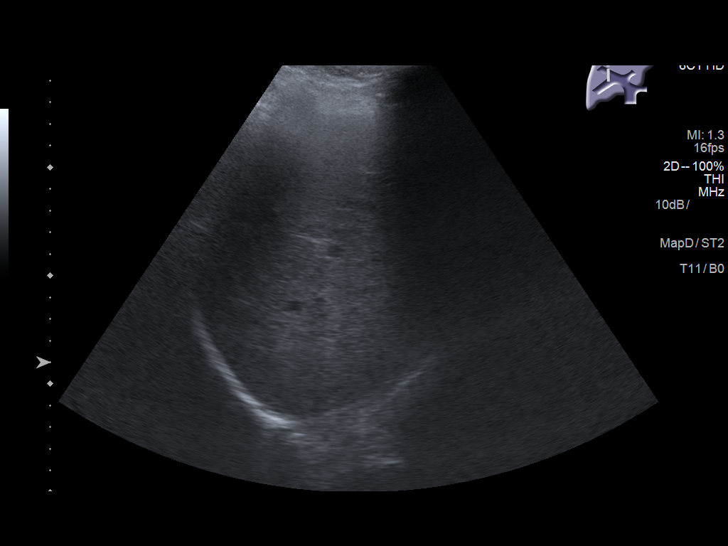
[im 35/47]
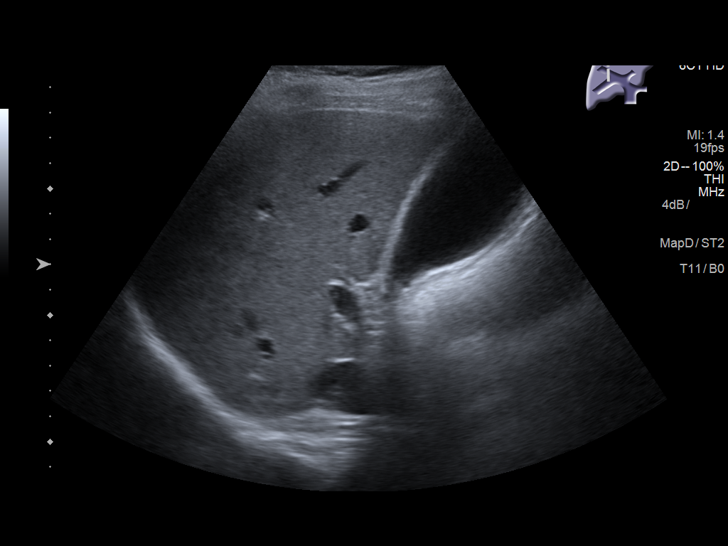
[im 39/47]
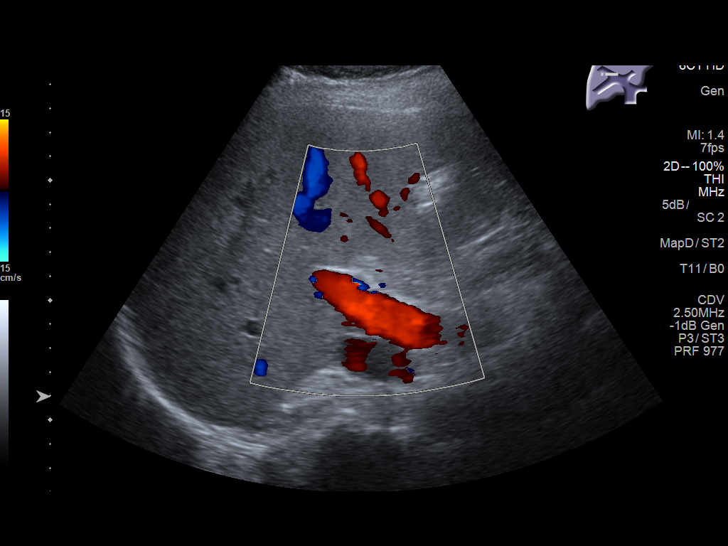
[im 43/47]
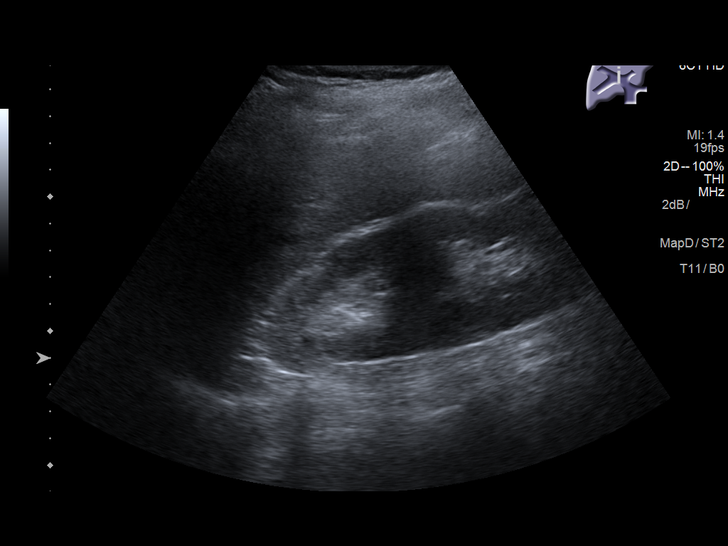
[im 47/47]
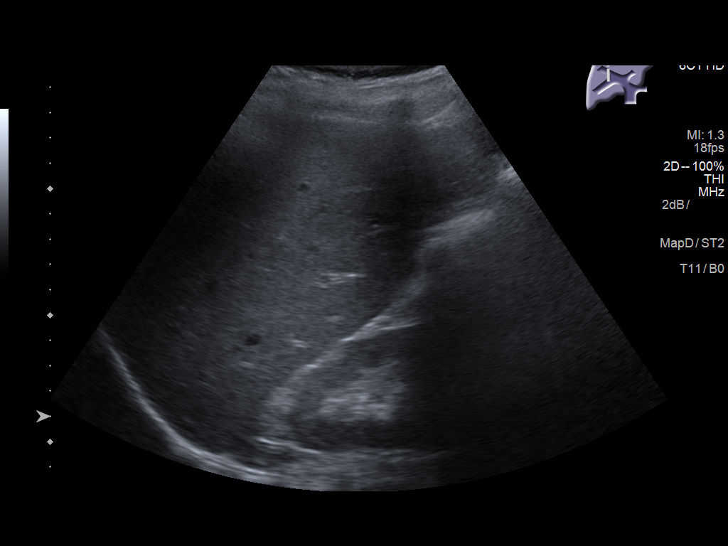

[14 of 25 positions shown; findings below may reference images not displayed]

FINDINGS: Gallbladder:

No gallstones or wall thickening visualized. No sonographic Murphy
sign noted by sonographer.

Common bile duct:

Diameter: 2 mm

Liver:

The liver is unremarkable. Portal vein is patent on color Doppler
imaging with normal direction of blood flow towards the liver.
IMPRESSION: Unremarkable right upper quadrant ultrasound.

## 2019-02-24 ENCOUNTER — Inpatient Hospital Stay: Payer: BLUE CROSS/BLUE SHIELD

## 2019-02-24 ENCOUNTER — Inpatient Hospital Stay
Admission: EM | Admit: 2019-02-24 | Discharge: 2019-02-25 | DRG: 066 | Disposition: A | Discharge status: Home / Self Care | Payer: BLUE CROSS/BLUE SHIELD | Attending: Internal Medicine | Admitting: Internal Medicine

## 2019-02-24 ENCOUNTER — Other Ambulatory Visit: Payer: Self-pay

## 2019-02-24 ENCOUNTER — Emergency Department: Payer: BLUE CROSS/BLUE SHIELD

## 2019-02-24 DIAGNOSIS — F17210 Nicotine dependence, cigarettes, uncomplicated: Secondary | ICD-10-CM | POA: Diagnosis present

## 2019-02-24 DIAGNOSIS — Z7982 Long term (current) use of aspirin: Secondary | ICD-10-CM

## 2019-02-24 DIAGNOSIS — Z79899 Other long term (current) drug therapy: Secondary | ICD-10-CM | POA: Diagnosis not present

## 2019-02-24 DIAGNOSIS — E785 Hyperlipidemia, unspecified: Secondary | ICD-10-CM | POA: Diagnosis present

## 2019-02-24 DIAGNOSIS — R29702 NIHSS score 2: Secondary | ICD-10-CM | POA: Diagnosis present

## 2019-02-24 DIAGNOSIS — G8314 Monoplegia of lower limb affecting left nondominant side: Secondary | ICD-10-CM | POA: Diagnosis not present

## 2019-02-24 DIAGNOSIS — E119 Type 2 diabetes mellitus without complications: Secondary | ICD-10-CM | POA: Diagnosis present

## 2019-02-24 DIAGNOSIS — I639 Cerebral infarction, unspecified: Secondary | ICD-10-CM | POA: Diagnosis present

## 2019-02-24 DIAGNOSIS — Z7984 Long term (current) use of oral hypoglycemic drugs: Secondary | ICD-10-CM

## 2019-02-24 DIAGNOSIS — F419 Anxiety disorder, unspecified: Secondary | ICD-10-CM | POA: Diagnosis present

## 2019-02-24 DIAGNOSIS — R269 Unspecified abnormalities of gait and mobility: Secondary | ICD-10-CM | POA: Diagnosis present

## 2019-02-24 DIAGNOSIS — I10 Essential (primary) hypertension: Secondary | ICD-10-CM | POA: Diagnosis present

## 2019-02-24 DIAGNOSIS — F40240 Claustrophobia: Secondary | ICD-10-CM | POA: Diagnosis present

## 2019-02-24 LAB — COMPREHENSIVE METABOLIC PANEL
ALT: 15 U/L (ref 0–44)
AST: 15 U/L (ref 15–41)
Albumin: 3.9 g/dL (ref 3.5–5.0)
Alkaline Phosphatase: 78 U/L (ref 38–126)
Anion gap: 10 (ref 5–15)
BUN: 15 mg/dL (ref 6–20)
CO2: 21 mmol/L — ABNORMAL LOW (ref 22–32)
Calcium: 10 mg/dL (ref 8.9–10.3)
Chloride: 110 mmol/L (ref 98–111)
Creatinine, Ser: 0.72 mg/dL (ref 0.44–1.00)
GFR calc Af Amer: >60 mL/min (ref >60)
GFR calc non Af Amer: >60 mL/min (ref >60)
Glucose, Bld: 183 mg/dL — ABNORMAL HIGH (ref 70–99)
Potassium: 3.7 mmol/L (ref 3.5–5.1)
Sodium: 141 mmol/L (ref 135–145)
Total Bilirubin: 0.6 mg/dL (ref 0.3–1.2)
Total Protein: 6.9 g/dL (ref 6.5–8.1)

## 2019-02-24 LAB — APTT: aPTT: 32 seconds (ref 24–36)

## 2019-02-24 LAB — DIFFERENTIAL
Abs Immature Granulocytes: 0.03 10*3/uL (ref 0.00–0.07)
Basophils Absolute: 0 10*3/uL (ref 0.0–0.1)
Basophils Relative: 0 %
Eosinophils Absolute: 0.2 10*3/uL (ref 0.0–0.5)
Eosinophils Relative: 2 %
Immature Granulocytes: 0 %
Lymphocytes Relative: 27 %
Lymphs Abs: 2.3 10*3/uL (ref 0.7–4.0)
Monocytes Absolute: 0.4 10*3/uL (ref 0.1–1.0)
Monocytes Relative: 5 %
Neutro Abs: 5.7 10*3/uL (ref 1.7–7.7)
Neutrophils Relative %: 66 %

## 2019-02-24 LAB — CBC
HCT: 35 % — ABNORMAL LOW (ref 36.0–46.0)
Hemoglobin: 11.5 g/dL — ABNORMAL LOW (ref 12.0–15.0)
MCH: 26.6 pg (ref 26.0–34.0)
MCHC: 32.9 g/dL (ref 30.0–36.0)
MCV: 81 fL (ref 80.0–100.0)
Platelets: 285 10*3/uL (ref 150–400)
RBC: 4.32 MIL/uL (ref 3.87–5.11)
RDW: 13.8 % (ref 11.5–15.5)
WBC: 8.7 10*3/uL (ref 4.0–10.5)
nRBC: 0 % (ref 0.0–0.2)

## 2019-02-24 LAB — GLUCOSE, CAPILLARY: Glucose-Capillary: 67 mg/dL — ABNORMAL LOW (ref 70–99)

## 2019-02-24 LAB — PROTIME-INR
INR: 1 (ref 0.8–1.2)
Prothrombin Time: 13.1 seconds (ref 11.4–15.2)

## 2019-02-24 LAB — TROPONIN I: Troponin I: <0.03 ng/mL (ref <0.03)

## 2019-02-24 MED ORDER — HYDRALAZINE HCL 20 MG/ML IJ SOLN
10.0000 mg | Freq: Four times a day (QID) | INTRAMUSCULAR | Status: DC | PRN
  Administered 2019-02-24: 10 mg via INTRAVENOUS
  Filled 2019-02-24: qty 1

## 2019-02-24 MED ORDER — ATORVASTATIN CALCIUM 20 MG PO TABS
40.0000 mg | ORAL_TABLET | Freq: Every day | ORAL | Status: DC
  Filled 2019-02-24: qty 2

## 2019-02-24 MED ORDER — LORAZEPAM 2 MG/ML IJ SOLN: 1.0000 mg | Freq: Once | INTRAMUSCULAR | Status: DC

## 2019-02-24 MED ORDER — ASPIRIN 81 MG PO CHEW
324.0000 mg | CHEWABLE_TABLET | Freq: Once | ORAL | Status: AC
  Administered 2019-02-24: 324 mg via ORAL
  Filled 2019-02-24: qty 4

## 2019-02-24 MED ORDER — FUROSEMIDE 40 MG PO TABS
20.0000 mg | ORAL_TABLET | Freq: Every day | ORAL | Status: DC
  Administered 2019-02-24: 20 mg via ORAL
  Filled 2019-02-24: qty 1

## 2019-02-24 MED ORDER — ASPIRIN EC 81 MG PO TBEC: 81.0000 mg | DELAYED_RELEASE_TABLET | Freq: Every day | ORAL | Status: DC

## 2019-02-24 MED ORDER — INSULIN ASPART 100 UNIT/ML ~~LOC~~ SOLN: 0.0000 [IU] | Freq: Three times a day (TID) | SUBCUTANEOUS | Status: DC

## 2019-02-24 MED ORDER — CLOPIDOGREL BISULFATE 75 MG PO TABS: 75.0000 mg | ORAL_TABLET | Freq: Every day | ORAL | Status: DC

## 2019-02-24 MED ORDER — NICOTINE 21 MG/24HR TD PT24
21.0000 mg | MEDICATED_PATCH | Freq: Every day | TRANSDERMAL | Status: DC
  Administered 2019-02-24: 21 mg via TRANSDERMAL
  Filled 2019-02-24: qty 1

## 2019-02-24 MED ORDER — GABAPENTIN 100 MG PO CAPS
100.0000 mg | ORAL_CAPSULE | Freq: Three times a day (TID) | ORAL | Status: DC
  Filled 2019-02-24: qty 1

## 2019-02-24 MED ORDER — ENOXAPARIN SODIUM 40 MG/0.4ML ~~LOC~~ SOLN
40.0000 mg | SUBCUTANEOUS | Status: DC
  Administered 2019-02-24: 40 mg via SUBCUTANEOUS
  Filled 2019-02-24: qty 0.4

## 2019-02-24 MED ORDER — STROKE: EARLY STAGES OF RECOVERY BOOK: Freq: Once | Status: DC

## 2019-02-24 MED ORDER — ACETAMINOPHEN 650 MG RE SUPP: 650.0000 mg | RECTAL | Status: DC | PRN

## 2019-02-24 MED ORDER — LORAZEPAM 2 MG/ML IJ SOLN
1.0000 mg | Freq: Once | INTRAMUSCULAR | Status: AC
  Administered 2019-02-24: 1 mg via INTRAVENOUS
  Filled 2019-02-24: qty 1

## 2019-02-24 MED ORDER — ACETAMINOPHEN 160 MG/5ML PO SOLN
650.0000 mg | ORAL | Status: DC | PRN
  Filled 2019-02-24: qty 20.3

## 2019-02-24 MED ORDER — BUPROPION HCL ER (SR) 150 MG PO TB12: 150.0000 mg | ORAL_TABLET | Freq: Every day | ORAL | Status: DC

## 2019-02-24 MED ORDER — ACETAMINOPHEN 325 MG PO TABS: 650.0000 mg | ORAL_TABLET | ORAL | Status: DC | PRN

## 2019-02-24 MED ORDER — INSULIN ASPART 100 UNIT/ML ~~LOC~~ SOLN: 0.0000 [IU] | Freq: Every day | SUBCUTANEOUS | Status: DC

## 2019-02-24 MED ORDER — FERROUS GLUCONATE 324 (38 FE) MG PO TABS
325.0000 mg | ORAL_TABLET | Freq: Two times a day (BID) | ORAL | Status: DC
  Filled 2019-02-24: qty 1

## 2019-02-24 MED ORDER — GLIPIZIDE ER 10 MG PO TB24
10.0000 mg | ORAL_TABLET | Freq: Every day | ORAL | Status: DC
  Filled 2019-02-24: qty 1

## 2019-02-24 MED ORDER — SODIUM CHLORIDE 0.9% FLUSH: 3.0000 mL | Freq: Once | INTRAVENOUS | Status: DC

## 2019-02-24 NOTE — ED Notes (Signed)
Pt given crackers, drink at this time. Pt on phone with family ordering food with them

## 2019-02-24 NOTE — ED Notes (Signed)
Patient transported to MRI 

## 2019-02-24 NOTE — ED Provider Notes (Addendum)
[facility] Emergency Department Provider Note   ____________________________________________   First MD Initiated Contact with Patient 02/24/19 1636     (approximate)  I have reviewed the triage vital signs and the nursing notes.   HISTORY  Chief Complaint Weakness    HPI Joanna [name] is a 53 y.o. female who reports she woke up from sleeping yesterday with left-sided weakness.  She did not come in right away because she thought maybe she had slept wrong.  Headache did not get better today so she did come in now.  Past medical history is only significant for diabetes.  Patient reports she had a COVID test 3 days ago which came back negative today.         Past Medical History:  Diagnosis Date  . Diabetes mellitus without complication ([place])     There are no active problems to display for this patient.   History reviewed. No pertinent surgical history.  Prior to Admission medications   Medication Sig Start Date End Date Taking? Authorizing Provider  albuterol (PROVENTIL HFA;VENTOLIN HFA) 108 (90 Base) MCG/ACT inhaler Inhale 2-4 puffs by mouth every 4 hours as needed for wheezing, cough, and/or shortness of breath 05/29/16   Hinda Kehr, MD  aspirin EC 81 MG tablet Take 81 mg by mouth daily. 12/29/14   [provider]  atorvastatin (LIPITOR) 40 MG tablet Take 40 mg by mouth daily. 05/04/18   [provider]  buPROPion (WELLBUTRIN SR) 150 MG 12 hr tablet Take 150 mg by mouth daily. 01/20/15   [provider]  famotidine (PEPCID) 20 MG tablet Take 1 tablet (20 mg total) by mouth 2 (two) times daily for 15 days. 07/22/18 08/06/18  Arta Silence, MD  Ferrous Gluconate 325 (36 FE) MG TABS Take 325 mg by mouth 2 (two) times daily. 12/29/14   [provider]  furosemide (LASIX) 20 MG tablet Take 20 mg by mouth daily. 04/27/15   [provider]  gabapentin (NEURONTIN) 100 MG capsule Take 1 capsule (100 mg  total) by mouth 3 (three) times daily for 10 days. 11/24/18 12/04/18  Johnn Hai, PA-C  glipiZIDE (GLUCOTROL XL) 10 MG 24 hr tablet Take 10 mg by mouth daily with breakfast.    [provider]  lactulose (CHRONULAC) 10 GM/15ML solution Take 30 mLs (20 g total) by mouth daily as needed for mild constipation. Patient not taking: Reported on 07/22/2018 11/30/17   Paulette Blanch, MD  lisinopril (PRINIVIL,ZESTRIL) 10 MG tablet Take 10 mg by mouth daily. 07/12/15   [provider]  metFORMIN (GLUCOPHAGE-XR) 500 MG 24 hr tablet Take 2,000 mg by mouth daily. 04/27/15   [provider]  metoCLOPramide (REGLAN) 10 MG tablet Take 1 tablet (10 mg total) by mouth every 8 (eight) hours as needed for up to 5 days for nausea or vomiting. 07/22/18 07/27/18  Arta Silence, MD  omeprazole (PRILOSEC) 40 MG capsule Take 40 mg by mouth daily. 06/10/18   [provider]  polyethylene glycol (MIRALAX / GLYCOLAX) packet Take 17 g by mouth daily. 11/07/18   Earleen Newport, MD    Allergies Patient has no known allergies.  No family history on file.  Social History Social History   Tobacco Use  . Smoking status: Current Every Day Smoker    Packs/day: 1.00    Types: Cigarettes  . Smokeless tobacco: Never Used  Substance Use Topics  . Alcohol use: Yes    Comment: occ  .  Drug use: Never    Review of Systems  Constitutional: No fever/chills Eyes: No visual changes. ENT: No sore throat. Cardiovascular: Denies chest pain. Respiratory: Denies shortness of breath. Gastrointestinal: No abdominal pain.  No nausea, no vomiting.  No diarrhea.  No constipation. Genitourinary: Negative for dysuria. Musculoskeletal: Negative for back pain. Skin: Negative for rash. Neurological: Negative for headaches, see HPI  ____________________________________________   PHYSICAL EXAM:  VITAL SIGNS: ED Triage Vitals  Enc Vitals Group     BP 02/24/19 1553 (!) 153/81     Pulse  Rate 02/24/19 1553 66     Resp 02/24/19 1553 17     Temp 02/24/19 1553 99 F (37.2 C)     Temp Source 02/24/19 1553 Oral     SpO2 02/24/19 1553 99 %     Weight 02/24/19 1554 165 lb (74.8 kg)     Height 02/24/19 1554 5\' 7"  (1.702 m)     Head Circumference --      Peak Flow --      Pain Score 02/24/19 1554 0     Pain Loc --      Pain Edu? --      Excl. in GC? --     Constitutional: Alert and oriented. Well appearing and in no acute distress. Eyes: Conjunctivae are normal. PERRL. EOMI. Head: Atraumatic. Nose: No congestion/rhinnorhea. Mouth/Throat: Mucous membranes are moist.  Oropharynx non-erythematous. Neck: No stridor.  Cardiovascular: Normal rate, regular rhythm. Grossly normal heart sounds.  Good peripheral circulation. Respiratory: Normal respiratory effort.  No retractions. Lungs CTAB. Gastrointestinal: Soft and nontender. No distention. No abdominal bruits. No CVA tenderness. Musculoskeletal: No lower extremity tenderness nor edema.   Neurologic:  Normal speech and language patient's cranial nerves II through XII are intact visual fields are normal to confrontation.  Cerebellar finger-nose slightly slowed on the left seems to be more due to weakness and ataxia.  Motor strength on the left is intact although patient says she feels weaker on the left and cannot hold things for very long with the left hand there is notable weakness in the left leg however.  Patient is unable to keep the left leg up off the bed for more than about 20 seconds.  He is unable to complete cerebellar testing with the left leg at all because of her weakness. Skin:  Skin is warm, dry and intact. No rash noted.   ____________________________________________   LABS (all labs ordered are listed, but only abnormal results are displayed)  Labs Reviewed  CBC - Abnormal; Notable for the following components:      Result Value   Hemoglobin 11.5 (*)    HCT 35.0 (*)    All other components within normal  limits  COMPREHENSIVE METABOLIC PANEL - Abnormal; Notable for the following components:   CO2 21 (*)    Glucose, Bld 183 (*)    All other components within normal limits  DIFFERENTIAL  APTT  PROTIME-INR  TROPONIN I  I-STAT CREATININE, ED  CBG MONITORING, ED  POC URINE PREG, ED   ____________________________________________  EKG  EKG read interpreted by me shows normal sinus rhythm rate of 67 normal axis EKG is essentially normal. ____________________________________________  RADIOLOGY  ED MD interpretation: CT read by radiology reviewed by me shows no acute changes  Official radiology report(s): Ct Head Wo Contrast  Result Date: 02/24/2019 CLINICAL DATA:  Left-sided weakness EXAM: CT HEAD WITHOUT CONTRAST TECHNIQUE: Contiguous axial images were obtained from the base of the skull through the vertex  without intravenous contrast. COMPARISON:  04/16/2014 FINDINGS: Brain: No evidence of acute infarction, hemorrhage, hydrocephalus, extra-axial collection or mass lesion/mass effect. Vascular: No hyperdense vessel or unexpected calcification. Skull: Normal. Negative for fracture or focal lesion. Sinuses/Orbits: No acute finding. Other: None. IMPRESSION: No acute intracranial pathology. Consider MRI to more sensitively evaluate for acute diffusion restricting infarction if suspected. Electronically Signed   By: Lauralyn PrimesAlex  Bibbey M.D.   On: 02/24/2019 16:44    ____________________________________________   PROCEDURES  Procedure(s) performed (including Critical Care):  Procedures   ____________________________________________   INITIAL IMPRESSION / ASSESSMENT AND PLAN / ED COURSE  Patient with history consistent with recent stroke.  She is not a candidate for TPA at this point.  She will need to be admitted for work-up for etiology of the stroke and to prevent any further worsening. Joanna [name] was evaluated in Emergency Department on 02/24/2019 for the symptoms described in the  history of present illness. She was evaluated in the context of the global COVID-19 pandemic, which necessitated consideration that the patient might be at risk for infection with the SARS-CoV-2 virus that causes COVID-19. Institutional protocols and algorithms that pertain to the evaluation of patients at risk for COVID-19 are in a state of rapid change based on information released by regulatory bodies including the CDC and federal and state organizations. These policies and algorithms were followed during the patient's care in the ED.    Joanna [name] was evaluated in Emergency Department on 02/24/2019 for the symptoms described in the history of present illness. She was evaluated in the context of the global COVID-19 pandemic, which necessitated consideration that the patient might be at risk for infection with the SARS-CoV-2 virus that causes COVID-19. Institutional protocols and algorithms that pertain to the evaluation of patients at risk for COVID-19 are in a state of rapid change based on information released by regulatory bodies including the CDC and federal and state organizations. These policies and algorithms were followed during the patient's care in the ED.         ____________________________________________   FINAL CLINICAL IMPRESSION(S) / ED DIAGNOSES  Final diagnoses:  Cerebrovascular accident (CVA), unspecified mechanism [facility](HCC)     ED Discharge Orders    None       Note:  This document was prepared using Dragon voice recognition software and may include unintentional dictation errors.    Arnaldo NatalMalinda, Micah Galeno F, MD 02/24/19 [id number] [id number]    Arnaldo NatalMalinda, Brycin Kille F, MD 02/24/19 [phone]

## 2019-02-24 NOTE — H&P (Signed)
[facility] [place] NAME: Joanna [name]    MR#:  [id number]  DATE OF BIRTH:  1965-09-06  DATE OF ADMISSION:  02/24/2019  PRIMARY CARE PHYSICIAN: System, Pcp Not In   REQUESTING/REFERRING PHYSICIAN:  Dr Cinda Quest  CHIEF COMPLAINT:   Left leg weakness HISTORY OF PRESENT ILLNESS:  Joanna [name]  is a 53 y.o. female with a known history of diabetes, hyperlipidemia, tobacco dependence and hypertension who presents today to the emergency room due to left-sided weakness.  Patient reports that yesterday morning when she woke up she developed left-sided weakness and was unable to ambulate.  She did take a fall due to the weakness of her left side.  She also reports left-sided numbness of her arm.  She thought that she slept wrong and therefore did not come to the ER yesterday.  Since her symptoms persisted she presented to the emergency room today.  She reports no other neurological deficits.  She reports she had a COVID testing 3 days ago which came back negative.  Patient has no shortness of breath, fever or cough.  Patient is not a candidate for TPA at this point due to her symptoms occurring since yesterday. CT head is negative for acute stroke. PAST MEDICAL HISTORY:   Past Medical History:  Diagnosis Date  . Diabetes mellitus without complication ([place])     PAST SURGICAL HISTORY:  History reviewed. No pertinent surgical history.  SOCIAL HISTORY:   Social History   Tobacco Use  . Smoking status: Current Every Day Smoker    Packs/day: 1.00    Types: Cigarettes  . Smokeless tobacco: Never Used  Substance Use Topics  . Alcohol use: Yes    Comment: occ    FAMILY HISTORY:  No family history on file.  DRUG ALLERGIES:  No Known Allergies  REVIEW OF SYSTEMS:   Review of Systems  Constitutional: Negative.  Negative for chills, fever and malaise/fatigue.  HENT: Negative.  Negative for ear discharge, ear pain, hearing loss, nosebleeds and  sore throat.   Eyes: Negative.  Negative for blurred vision and pain.  Respiratory: Negative.  Negative for cough, hemoptysis, shortness of breath and wheezing.   Cardiovascular: Negative.  Negative for chest pain, palpitations and leg swelling.  Gastrointestinal: Negative.  Negative for abdominal pain, blood in stool, diarrhea, nausea and vomiting.  Genitourinary: Negative.  Negative for dysuria.  Musculoskeletal: Negative.  Negative for back pain.  Skin: Negative.   Neurological: Positive for focal weakness and headaches. Negative for dizziness, tremors, speech change and seizures.  Endo/Heme/Allergies: Negative.  Does not bruise/bleed easily.  Psychiatric/Behavioral: Negative.  Negative for depression, hallucinations and suicidal ideas.    MEDICATIONS AT HOME:   Prior to Admission medications   Medication Sig Start Date End Date Taking? Authorizing Provider  albuterol (PROVENTIL HFA;VENTOLIN HFA) 108 (90 Base) MCG/ACT inhaler Inhale 2-4 puffs by mouth every 4 hours as needed for wheezing, cough, and/or shortness of breath 05/29/16   Hinda Kehr, MD  aspirin EC 81 MG tablet Take 81 mg by mouth daily. 12/29/14   [provider]  atorvastatin (LIPITOR) 40 MG tablet Take 40 mg by mouth daily. 05/04/18   [provider]  buPROPion (WELLBUTRIN SR) 150 MG 12 hr tablet Take 150 mg by mouth daily. 01/20/15   [provider]  famotidine (PEPCID) 20 MG tablet Take 1 tablet (20 mg total) by mouth 2 (two) times daily for 15 days. 07/22/18 08/06/18  Arta Silence, MD  Ferrous  Gluconate 325 (36 FE) MG TABS Take 325 mg by mouth 2 (two) times daily. 12/29/14   [provider]  furosemide (LASIX) 20 MG tablet Take 20 mg by mouth daily. 04/27/15   [provider]  gabapentin (NEURONTIN) 100 MG capsule Take 1 capsule (100 mg total) by mouth 3 (three) times daily for 10 days. 11/24/18 12/04/18  Tommi RumpsSummers, Rhonda L, PA-C  glipiZIDE (GLUCOTROL XL) 10 MG 24 hr tablet Take  10 mg by mouth daily with breakfast.    [provider]  lactulose (CHRONULAC) 10 GM/15ML solution Take 30 mLs (20 g total) by mouth daily as needed for mild constipation. Patient not taking: Reported on 07/22/2018 11/30/17   Irean HongSung, Jade J, MD  lisinopril (PRINIVIL,ZESTRIL) 10 MG tablet Take 10 mg by mouth daily. 07/12/15   [provider]  metFORMIN (GLUCOPHAGE-XR) 500 MG 24 hr tablet Take 2,000 mg by mouth daily. 04/27/15   [provider]  metoCLOPramide (REGLAN) 10 MG tablet Take 1 tablet (10 mg total) by mouth every 8 (eight) hours as needed for up to 5 days for nausea or vomiting. 07/22/18 07/27/18  Dionne BucySiadecki, Sebastian, MD  omeprazole (PRILOSEC) 40 MG capsule Take 40 mg by mouth daily. 06/10/18   [provider]  polyethylene glycol (MIRALAX / GLYCOLAX) packet Take 17 g by mouth daily. 11/07/18   Emily FilbertWilliams, Jonathan E, MD      VITAL SIGNS:  Blood pressure (!) 153/81, pulse 66, temperature 99 F (37.2 C), temperature source Oral, resp. rate 17, height 5\' 7"  (1.702 m), weight 74.8 kg, SpO2 99 %.  PHYSICAL EXAMINATION:   Physical Exam Constitutional:      General: She is not in acute distress. HENT:     Head: Normocephalic.  Eyes:     General: No scleral icterus. Neck:     Musculoskeletal: Normal range of motion and neck supple.     Vascular: No JVD.     Trachea: No tracheal deviation.  Cardiovascular:     Rate and Rhythm: Normal rate and regular rhythm.     Heart sounds: Normal heart sounds. No murmur. No friction rub. No gallop.   Pulmonary:     Effort: Pulmonary effort is normal. No respiratory distress.     Breath sounds: Normal breath sounds. No wheezing or rales.  Chest:     Chest wall: No tenderness.  Abdominal:     General: Bowel sounds are normal. There is no distension.     Palpations: Abdomen is soft. There is no mass.     Tenderness: There is no abdominal tenderness. There is no guarding or rebound.  Musculoskeletal: Normal range of  motion.  Skin:    General: Skin is warm.     Findings: No erythema or rash.  Neurological:     Mental Status: She is alert and oriented to person, place, and time.     Sensory: Sensory deficit present.     Motor: Weakness present.     Gait: Gait abnormal.  Psychiatric:        Mood and Affect: Mood normal.        Behavior: Behavior normal.        Thought Content: Thought content normal.        Judgment: Judgment normal.       LABORATORY PANEL:   CBC Recent Labs  Lab 02/24/19 1556  WBC 8.7  HGB 11.5*  HCT 35.0*  PLT 285   ------------------------------------------------------------------------------------------------------------------  Chemistries  Recent Labs  Lab 02/24/19 1556  NA 141  K 3.7  CL 110  CO2 21*  GLUCOSE 183*  BUN 15  CREATININE 0.72  CALCIUM 10.0  AST 15  ALT 15  ALKPHOS 78  BILITOT 0.6   ------------------------------------------------------------------------------------------------------------------  Cardiac Enzymes No results for input(s): TROPONINI in the last 168 hours. ------------------------------------------------------------------------------------------------------------------  RADIOLOGY:  Ct Head Wo Contrast  Result Date: 02/24/2019 CLINICAL DATA:  Left-sided weakness EXAM: CT HEAD WITHOUT CONTRAST TECHNIQUE: Contiguous axial images were obtained from the base of the skull through the vertex without intravenous contrast. COMPARISON:  04/16/2014 FINDINGS: Brain: No evidence of acute infarction, hemorrhage, hydrocephalus, extra-axial collection or mass lesion/mass effect. Vascular: No hyperdense vessel or unexpected calcification. Skull: Normal. Negative for fracture or focal lesion. Sinuses/Orbits: No acute finding. Other: None. IMPRESSION: No acute intracranial pathology. Consider MRI to more sensitively evaluate for acute diffusion restricting infarction if suspected. Electronically Signed   By: Lauralyn PrimesAlex  Bibbey M.D.   On: 02/24/2019  16:44    EKG:  Normal sinus rhythm no ST elevation or depression  IMPRESSION AND PLAN:   53 year old female with history of tobacco dependence, hypertension hyperlipidemia presents with left-sided weakness.  1.  Acute CVA: Patient's clinical examination is consistent with stroke. Continue with stroke work-up including MRI/MRA with 1 mg Ativan prior to imaging due to anxiety and claustrophobia, echocardiogram and carotid Doppler. Continue baby aspirin and add Plavix 75 mg daily. Neurology consultation requested. Continue statin and check lipid panel and A1c. Allow permissive hypertension. PT, OT and speech consultation requested.   2.  Essential hypertension: Hold lisinopril for now and allow permissive hypertension with PRN hydralazine ordered.  3.  Hyperlipidemia: Continue statin and check lipid panel. 4.  Diabetes: Continue outpatient regimen.  Diabetes nurse consultation requested.  Continue sliding scale with ADA diet.   5.Tobacco dependence: Patient is encouraged to quit smoking and willing to attempt to quit was assessed. Patient highly motivated.Counseling was provided for 4 minutes.   All the records are reviewed and case discussed with ED provider. Management plans discussed with the patient and she is in agreement  CODE STATUS: full  TOTAL TIME TAKING CARE OF THIS PATIENT: 45 minutes.    Adrian SaranSital Daisee Centner M.D on 02/24/2019 at 7:46 PM  Between 7am to 6pm - Pager - [phone]  After 6pm go to [email or web address] - password [email or web address]  [facility]  Office  [phone]  CC: Primary care physician; System, Pcp Not In

## 2019-02-24 NOTE — ED Notes (Signed)
Attempted to collect blood sample for HIV antibody and pt refusing at this time

## 2019-02-24 NOTE — ED Triage Notes (Signed)
Pt states she woke up yesterday morning with weakness to the left arm and leg, states she is off balance and having trouble ambulating. States she didn't come in sooner because she thought maybe she slept wrong or something. States the sx have not improved,

## 2019-02-25 NOTE — ED Notes (Signed)
ED TO INPATIENT HANDOFF REPORT  ED Nurse Name and Phone #: Gwynneth MunsonButch RN [phone]  S Name/Age/Gender Joanna [name] 53 y.o. female Room/Bed: ED19A/ED19A  Code Status   Code Status: Full Code  Home/SNF/Other Home Patient oriented to: self, place, time and situation Is this baseline? Yes   Triage Complete: Triage complete  Chief Complaint left side pain  Triage Note Pt states she woke up yesterday morning with weakness to the left arm and leg, states she is off balance and having trouble ambulating. States she didn't come in sooner because she thought maybe she slept wrong or something. States the sx have not improved,    Allergies No Known Allergies  Level of Care/Admitting Diagnosis ED Disposition    ED Disposition Condition Comment   Admit  [facility]ospital [facility] [[id number]]  Level of Care: Med-Surg [16]  Covid Evaluation: N/A  Diagnosis: CVA (cerebral vascular accident) [facility](HCC) [[id number]]  Admitting Physician: MODY, Patricia PesaSITAL [[id number]]  Attending Physician: MODY, Patricia PesaSITAL [[id number]]  Estimated length of stay: 3 - 4 days  Certification:: I certify this patient will need inpatient services for at least 2 midnights  PT Class (Do Not Modify): Inpatient [101]  PT Acc Code (Do Not Modify): Private [1]       B Medical/Surgery History Past Medical History:  Diagnosis Date  . Diabetes mellitus without complication ([facility])    History reviewed. No pertinent surgical history.   A IV Location/Drains/Wounds Patient Lines/Drains/Airways Status   Active Line/Drains/Airways    Name:   Placement date:   Placement time:   Site:   Days:   Peripheral IV 02/24/19 Right Arm   02/24/19    1909    Arm   1          Intake/Output Last 24 hours No intake or output data in the 24 hours ending 02/25/19 0021  Labs/Imaging Results for orders placed or performed during the hospital encounter of 02/24/19 (from the past 48 hour(s))  CBC     Status: Abnormal   Collection Time:  02/24/19  3:56 PM  Result Value Ref Range   WBC 8.7 4.0 - 10.5 K/uL   RBC 4.32 3.87 - 5.11 MIL/uL   Hemoglobin 11.5 (L) 12.0 - 15.0 g/dL   HCT 13.035.0 (L) 86.536.0 - 78.446.0 %   MCV 81.0 80.0 - 100.0 fL   MCH 26.6 26.0 - 34.0 pg   MCHC 32.9 30.0 - 36.0 g/dL   RDW 69.613.8 29.511.5 - 28.415.5 %   Platelets 285 150 - 400 K/uL   nRBC 0.0 0.0 - 0.2 %    Comment: Performed at [facility], [address] [place], [place] [address]  Differential     Status: None   Collection Time: 02/24/19  3:56 PM  Result Value Ref Range   Neutrophils Relative % 66 %   Neutro Abs 5.7 1.7 - 7.7 K/uL   Lymphocytes Relative 27 %   Lymphs Abs 2.3 0.7 - 4.0 K/uL   Monocytes Relative 5 %   Monocytes Absolute 0.4 0.1 - 1.0 K/uL   Eosinophils Relative 2 %   Eosinophils Absolute 0.2 0.0 - 0.5 K/uL   Basophils Relative 0 %   Basophils Absolute 0.0 0.0 - 0.1 K/uL   Immature Granulocytes 0 %   Abs Immature Granulocytes 0.03 0.00 - 0.07 K/uL    Comment: Performed at [facility], [address] [place], [place] [address]  Comprehensive metabolic panel     Status: Abnormal  Collection Time: 02/24/19  3:56 PM  Result Value Ref Range   Sodium 141 135 - 145 mmol/L   Potassium 3.7 3.5 - 5.1 mmol/L   Chloride 110 98 - 111 mmol/L   CO2 21 (L) 22 - 32 mmol/L   Glucose, Bld 183 (H) 70 - 99 mg/dL   BUN 15 6 - 20 mg/dL   Creatinine, Ser 1.610.72 0.44 - 1.00 mg/dL   Calcium 09.610.0 8.9 - 04.510.3 mg/dL   Total Protein 6.9 6.5 - 8.1 g/dL   Albumin 3.9 3.5 - 5.0 g/dL   AST 15 15 - 41 U/L   ALT 15 0 - 44 U/L   Alkaline Phosphatase 78 38 - 126 U/L   Total Bilirubin 0.6 0.3 - 1.2 mg/dL   GFR calc non Af Amer >60 >60 mL/min   GFR calc Af Amer >60 >60 mL/min   Anion gap 10 5 - 15    Comment: Performed at [facility], [address] [place], [place] [address]  APTT     Status: None   Collection Time: 02/24/19  3:56 PM  Result Value Ref Range   aPTT 32 24 - 36 seconds    Comment: Performed at [facility],  [address] [place], [place] [address]  Protime-INR     Status: None   Collection Time: 02/24/19  3:56 PM  Result Value Ref Range   Prothrombin Time 13.1 11.4 - 15.2 seconds   INR 1.0 0.8 - 1.2    Comment: (NOTE) INR goal varies based on device and disease states. Performed at [facility], [address] [place], [place] [address]   Troponin I - ONCE - STAT     Status: None   Collection Time: 02/24/19  3:56 PM  Result Value Ref Range   Troponin I <0.03 <0.03 ng/mL    Comment: Performed at [facility], [address] [place], [place] [address]  Glucose, capillary     Status: Abnormal   Collection Time: 02/24/19  9:46 PM  Result Value Ref Range   Glucose-Capillary 67 (L) 70 - 99 mg/dL   Ct Head Wo Contrast  Result Date: 02/24/2019 CLINICAL DATA:  Left-sided weakness EXAM: CT HEAD WITHOUT CONTRAST TECHNIQUE: Contiguous axial images were obtained from the base of the skull through the vertex without intravenous contrast. COMPARISON:  04/16/2014 FINDINGS: Brain: No evidence of acute infarction, hemorrhage, hydrocephalus, extra-axial collection or mass lesion/mass effect. Vascular: No hyperdense vessel or unexpected calcification. Skull: Normal. Negative for fracture or focal lesion. Sinuses/Orbits: No acute finding. Other: None. IMPRESSION: No acute intracranial pathology. Consider MRI to more sensitively evaluate for acute diffusion restricting infarction if suspected. Electronically Signed   By: Lauralyn PrimesAlex  Bibbey M.D.   On: 02/24/2019 16:44   Mr Brain Wo Contrast  Result Date: 02/24/2019 CLINICAL DATA:  Left-sided weakness EXAM: MRI HEAD WITHOUT CONTRAST TECHNIQUE: Multiplanar, multiecho pulse sequences of the brain and surrounding structures were obtained without intravenous contrast. COMPARISON:  Head CT 02/24/2019 FINDINGS: BRAIN: There is a small acute infarct within the ventral right pons. No acute hemorrhage or extra-axial collection. The midline structures are  normal. Old, small left cerebellar infarcts. The white matter signal is normal for the patient's age. The cerebral and cerebellar volume are age-appropriate. No hydrocephalus. Susceptibility-sensitive sequences show no chronic microhemorrhage or superficial siderosis. No mass lesion. VASCULAR: The major intracranial arterial and venous sinus flow voids are normal. SKULL AND UPPER CERVICAL SPINE: Calvarial bone marrow signal is normal. There is no  skull base mass. Visualized upper cervical spine and soft tissues are normal. SINUSES/ORBITS: No fluid levels or advanced mucosal thickening. No mastoid or middle ear effusion. The orbits are normal. IMPRESSION: Small acute infarct of the ventral right pons, in keeping with the reported left-sided weakness. No hemorrhage or mass effect. Electronically Signed   By: Ulyses Jarred M.D.   On: 02/24/2019 21:30    Pending Labs Unresulted Labs (From admission, onward)    Start     Ordered   03/03/19 0500  Creatinine, serum  (enoxaparin (LOVENOX)    CrCl >/= 30 ml/min)  Weekly,   STAT    Comments: while on enoxaparin therapy    02/24/19 1946   02/25/19 0500  Hemoglobin A1c  Tomorrow morning,   STAT     02/24/19 1945   02/25/19 0500  Lipid panel  Tomorrow morning,   STAT    Comments: Fasting    02/24/19 1945   02/24/19 1944  HIV antibody (Routine Testing)  Once,   STAT     02/24/19 1945          Vitals/Pain Today's Vitals   02/24/19 2035 02/24/19 2036 02/24/19 2044 02/24/19 2246  BP:   (!) 187/77 (!) 158/77  Pulse: 73 (!) 49  60  Resp:    18  Temp:      TempSrc:      SpO2: 95% 100%  100%  Weight:      Height:      PainSc:        Isolation Precautions No active isolations  Medications Medications  sodium chloride flush (NS) 0.9 % injection 3 mL (3 mLs Intravenous Not Given 02/24/19 1806)  aspirin EC tablet 81 mg (has no administration in time range)  furosemide (LASIX) tablet 20 mg (20 mg Oral Given 02/24/19 2202)  buPROPion (WELLBUTRIN SR)  12 hr tablet 150 mg (has no administration in time range)  ferrous gluconate (FERGON) tablet 325 mg (324 mg Oral Refused 02/24/19 2200)  glipiZIDE (GLUCOTROL XL) 24 hr tablet 10 mg (10 mg Oral Not Given 02/24/19 2201)  atorvastatin (LIPITOR) tablet 40 mg (40 mg Oral Refused 02/24/19 2203)  gabapentin (NEURONTIN) capsule 100 mg (100 mg Oral Refused 02/24/19 2202)  clopidogrel (PLAVIX) tablet 75 mg (has no administration in time range)  hydrALAZINE (APRESOLINE) injection 10 mg (10 mg Intravenous Given 02/24/19 2044)  nicotine (NICODERM CQ - dosed in mg/24 hours) patch 21 mg (21 mg Transdermal Patch Applied 02/24/19 2204)   stroke: mapping our early stages of recovery book (has no administration in time range)  acetaminophen (TYLENOL) tablet 650 mg (has no administration in time range)    Or  acetaminophen (TYLENOL) solution 650 mg (has no administration in time range)    Or  acetaminophen (TYLENOL) suppository 650 mg (has no administration in time range)  insulin aspart (novoLOG) injection 0-9 Units (has no administration in time range)  enoxaparin (LOVENOX) injection 40 mg (40 mg Subcutaneous Given 02/24/19 2203)  insulin aspart (novoLOG) injection 0-5 Units (0 Units Subcutaneous Not Given 02/24/19 2236)  LORazepam (ATIVAN) injection 1 mg (1 mg Intravenous Not Given 02/24/19 2236)  aspirin chewable tablet 324 mg (324 mg Oral Given 02/24/19 1901)  LORazepam (ATIVAN) injection 1 mg (1 mg Intravenous Given 02/24/19 2031)    Mobility walks Low fall risk   Focused Assessments    R Recommendations: See Admitting Provider Note  Report given to:   Additional Notes: None

## 2019-02-25 NOTE — ED Notes (Addendum)
Pt refusing to stay for admission despite having a bed ready. Pt upset with the amount of time she's spent here today in the ED, even after explaining to the pt in great detail the length of time and steps necessary it takes sometimes for admission into the impatient side of the hospital. Pt still demanding to leave; pt also refusing to allow d/c VS to be taken or sign out AMA.

## 2019-09-03 ENCOUNTER — Ambulatory Visit: Payer: BLUE CROSS/BLUE SHIELD | Attending: Internal Medicine

## 2019-09-03 DIAGNOSIS — Z20822 Contact with and (suspected) exposure to covid-19: Secondary | ICD-10-CM

## 2019-09-04 LAB — NOVEL CORONAVIRUS, NAA: SARS-CoV-2, NAA: NOT DETECTED

## 2021-03-04 ENCOUNTER — Observation Stay: Payer: Medicaid Other

## 2021-03-04 ENCOUNTER — Encounter: Payer: Self-pay | Admitting: Internal Medicine

## 2021-03-04 ENCOUNTER — Emergency Department: Payer: Medicaid Other

## 2021-03-04 ENCOUNTER — Observation Stay
Admission: EM | Admit: 2021-03-04 | Discharge: 2021-03-05 | Disposition: A | Discharge status: Home / Self Care | Payer: Medicaid Other | Attending: Internal Medicine | Admitting: Internal Medicine

## 2021-03-04 DIAGNOSIS — D509 Iron deficiency anemia, unspecified: Secondary | ICD-10-CM

## 2021-03-04 DIAGNOSIS — Z7984 Long term (current) use of oral hypoglycemic drugs: Secondary | ICD-10-CM | POA: Diagnosis not present

## 2021-03-04 DIAGNOSIS — F32A Depression, unspecified: Secondary | ICD-10-CM | POA: Insufficient documentation

## 2021-03-04 DIAGNOSIS — R262 Difficulty in walking, not elsewhere classified: Secondary | ICD-10-CM

## 2021-03-04 DIAGNOSIS — I10 Essential (primary) hypertension: Secondary | ICD-10-CM | POA: Diagnosis present

## 2021-03-04 DIAGNOSIS — I639 Cerebral infarction, unspecified: Secondary | ICD-10-CM | POA: Diagnosis not present

## 2021-03-04 DIAGNOSIS — R4781 Slurred speech: Secondary | ICD-10-CM | POA: Diagnosis present

## 2021-03-04 DIAGNOSIS — Z720 Tobacco use: Secondary | ICD-10-CM | POA: Insufficient documentation

## 2021-03-04 DIAGNOSIS — R531 Weakness: Secondary | ICD-10-CM

## 2021-03-04 DIAGNOSIS — E119 Type 2 diabetes mellitus without complications: Secondary | ICD-10-CM

## 2021-03-04 DIAGNOSIS — F17210 Nicotine dependence, cigarettes, uncomplicated: Secondary | ICD-10-CM | POA: Diagnosis not present

## 2021-03-04 DIAGNOSIS — U071 COVID-19: Secondary | ICD-10-CM | POA: Diagnosis present

## 2021-03-04 DIAGNOSIS — Z7982 Long term (current) use of aspirin: Secondary | ICD-10-CM | POA: Insufficient documentation

## 2021-03-04 DIAGNOSIS — K219 Gastro-esophageal reflux disease without esophagitis: Secondary | ICD-10-CM | POA: Diagnosis not present

## 2021-03-04 DIAGNOSIS — E785 Hyperlipidemia, unspecified: Secondary | ICD-10-CM | POA: Insufficient documentation

## 2021-03-04 DIAGNOSIS — Z79899 Other long term (current) drug therapy: Secondary | ICD-10-CM | POA: Insufficient documentation

## 2021-03-04 DIAGNOSIS — R278 Other lack of coordination: Secondary | ICD-10-CM

## 2021-03-04 HISTORY — DX: Essential (primary) hypertension: I10

## 2021-03-04 HISTORY — DX: Tobacco use: Z72.0

## 2021-03-04 HISTORY — DX: Iron deficiency anemia, unspecified: D50.9

## 2021-03-04 HISTORY — DX: Hyperlipidemia, unspecified: E78.5

## 2021-03-04 HISTORY — DX: Depression, unspecified: F32.A

## 2021-03-04 HISTORY — DX: Gastro-esophageal reflux disease without esophagitis: K21.9

## 2021-03-04 HISTORY — DX: Cerebral infarction, unspecified: I63.9

## 2021-03-04 LAB — COMPREHENSIVE METABOLIC PANEL
ALT: 13 U/L (ref 0–44)
AST: 17 U/L (ref 15–41)
Albumin: 4.1 g/dL (ref 3.5–5.0)
Alkaline Phosphatase: 71 U/L (ref 38–126)
Anion gap: 8 (ref 5–15)
BUN: 12 mg/dL (ref 6–20)
CO2: 24 mmol/L (ref 22–32)
Calcium: 9.9 mg/dL (ref 8.9–10.3)
Chloride: 109 mmol/L (ref 98–111)
Creatinine, Ser: 0.74 mg/dL (ref 0.44–1.00)
GFR, Estimated: >60 mL/min (ref >60)
Glucose, Bld: 173 mg/dL — ABNORMAL HIGH (ref 70–99)
Potassium: 3.6 mmol/L (ref 3.5–5.1)
Sodium: 141 mmol/L (ref 135–145)
Total Bilirubin: 0.8 mg/dL (ref 0.3–1.2)
Total Protein: 7.5 g/dL (ref 6.5–8.1)

## 2021-03-04 LAB — DIFFERENTIAL
Abs Immature Granulocytes: 0.02 10*3/uL (ref 0.00–0.07)
Basophils Absolute: 0 10*3/uL (ref 0.0–0.1)
Basophils Relative: 0 %
Eosinophils Absolute: 0.1 10*3/uL (ref 0.0–0.5)
Eosinophils Relative: 3 %
Immature Granulocytes: 0 %
Lymphocytes Relative: 36 %
Lymphs Abs: 1.9 10*3/uL (ref 0.7–4.0)
Monocytes Absolute: 0.3 10*3/uL (ref 0.1–1.0)
Monocytes Relative: 5 %
Neutro Abs: 3.1 10*3/uL (ref 1.7–7.7)
Neutrophils Relative %: 56 %

## 2021-03-04 LAB — CBC
HCT: 33.5 % — ABNORMAL LOW (ref 36.0–46.0)
Hemoglobin: 11.2 g/dL — ABNORMAL LOW (ref 12.0–15.0)
MCH: 27.1 pg (ref 26.0–34.0)
MCHC: 33.4 g/dL (ref 30.0–36.0)
MCV: 81.1 fL (ref 80.0–100.0)
Platelets: 261 10*3/uL (ref 150–400)
RBC: 4.13 MIL/uL (ref 3.87–5.11)
RDW: 14.6 % (ref 11.5–15.5)
WBC: 5.5 10*3/uL (ref 4.0–10.5)
nRBC: 0 % (ref 0.0–0.2)

## 2021-03-04 LAB — CBG MONITORING, ED
Glucose-Capillary: 171 mg/dL — ABNORMAL HIGH (ref 70–99)
Glucose-Capillary: 51 mg/dL — ABNORMAL LOW (ref 70–99)
Glucose-Capillary: 92 mg/dL (ref 70–99)

## 2021-03-04 LAB — APTT: aPTT: 33 seconds (ref 24–36)

## 2021-03-04 LAB — RESP PANEL BY RT-PCR (FLU A&B, COVID) ARPGX2
Influenza A by PCR: NEGATIVE
Influenza B by PCR: NEGATIVE
SARS Coronavirus 2 by RT PCR: POSITIVE — AB

## 2021-03-04 LAB — PROTIME-INR
INR: 1 (ref 0.8–1.2)
Prothrombin Time: 13.7 seconds (ref 11.4–15.2)

## 2021-03-04 MED ORDER — NICOTINE 21 MG/24HR TD PT24
21.0000 mg | MEDICATED_PATCH | Freq: Every day | TRANSDERMAL | Status: DC
  Administered 2021-03-04 – 2021-03-05 (×2): 21 mg via TRANSDERMAL
  Filled 2021-03-04 (×2): qty 1

## 2021-03-04 MED ORDER — STROKE: EARLY STAGES OF RECOVERY BOOK: Freq: Once | Status: DC

## 2021-03-04 MED ORDER — SENNOSIDES-DOCUSATE SODIUM 8.6-50 MG PO TABS: 1.0000 | ORAL_TABLET | Freq: Every evening | ORAL | Status: DC | PRN

## 2021-03-04 MED ORDER — ONDANSETRON HCL 4 MG/2ML IJ SOLN: 4.0000 mg | Freq: Three times a day (TID) | INTRAMUSCULAR | Status: DC | PRN

## 2021-03-04 MED ORDER — LORAZEPAM 2 MG/ML IJ SOLN: 0.5000 mg | Freq: Once | INTRAMUSCULAR | Status: AC

## 2021-03-04 MED ORDER — ATORVASTATIN CALCIUM 20 MG PO TABS
80.0000 mg | ORAL_TABLET | Freq: Every day | ORAL | Status: DC
  Administered 2021-03-05: 80 mg via ORAL
  Filled 2021-03-04: qty 4

## 2021-03-04 MED ORDER — ALBUTEROL SULFATE (2.5 MG/3ML) 0.083% IN NEBU: 2.5000 mg | INHALATION_SOLUTION | RESPIRATORY_TRACT | Status: DC | PRN

## 2021-03-04 MED ORDER — ACETAMINOPHEN 160 MG/5ML PO SOLN
650.0000 mg | ORAL | Status: DC | PRN
Start: 2021-03-04 — End: 2021-03-05
  Filled 2021-03-04: qty 20.3

## 2021-03-04 MED ORDER — PANTOPRAZOLE SODIUM 40 MG PO TBEC
40.0000 mg | DELAYED_RELEASE_TABLET | Freq: Every day | ORAL | Status: DC
  Administered 2021-03-05: 40 mg via ORAL
  Filled 2021-03-04: qty 1

## 2021-03-04 MED ORDER — CLOPIDOGREL BISULFATE 75 MG PO TABS
75.0000 mg | ORAL_TABLET | Freq: Every day | ORAL | Status: DC
  Administered 2021-03-05: 75 mg via ORAL
  Filled 2021-03-04: qty 1

## 2021-03-04 MED ORDER — ENOXAPARIN SODIUM 40 MG/0.4ML IJ SOSY: 40.0000 mg | PREFILLED_SYRINGE | INTRAMUSCULAR | Status: DC

## 2021-03-04 MED ORDER — LORAZEPAM 2 MG/ML IJ SOLN
INTRAMUSCULAR | Status: AC
  Administered 2021-03-04: 0.5 mg via INTRAVENOUS
  Filled 2021-03-04: qty 1

## 2021-03-04 MED ORDER — ACETAMINOPHEN 325 MG PO TABS
650.0000 mg | ORAL_TABLET | ORAL | Status: DC | PRN
Start: 2021-03-04 — End: 2021-03-05
  Administered 2021-03-04 – 2021-03-05 (×2): 650 mg via ORAL
  Filled 2021-03-04 (×2): qty 2

## 2021-03-04 MED ORDER — HYDRALAZINE HCL 20 MG/ML IJ SOLN: 5.0000 mg | INTRAMUSCULAR | Status: DC | PRN

## 2021-03-04 MED ORDER — INSULIN ASPART 100 UNIT/ML IJ SOLN: 0.0000 [IU] | Freq: Every day | INTRAMUSCULAR | Status: DC

## 2021-03-04 MED ORDER — DM-GUAIFENESIN ER 30-600 MG PO TB12: 1.0000 | ORAL_TABLET | Freq: Two times a day (BID) | ORAL | Status: DC | PRN

## 2021-03-04 MED ORDER — ALBUTEROL SULFATE HFA 108 (90 BASE) MCG/ACT IN AERS: 2.0000 | INHALATION_SPRAY | RESPIRATORY_TRACT | Status: DC | PRN

## 2021-03-04 MED ORDER — SODIUM CHLORIDE 0.9% FLUSH
3.0000 mL | Freq: Once | INTRAVENOUS | Status: AC
  Administered 2021-03-04: 3 mL via INTRAVENOUS

## 2021-03-04 MED ORDER — ACETAMINOPHEN 650 MG RE SUPP: 650.0000 mg | RECTAL | Status: DC | PRN

## 2021-03-04 MED ORDER — INSULIN ASPART 100 UNIT/ML IJ SOLN
0.0000 [IU] | Freq: Three times a day (TID) | INTRAMUSCULAR | Status: DC
Start: 2021-03-04 — End: 2021-03-05
  Filled 2021-03-04: qty 1

## 2021-03-04 MED ORDER — TAMSULOSIN HCL 0.4 MG PO CAPS
0.4000 mg | ORAL_CAPSULE | Freq: Every day | ORAL | Status: DC
  Administered 2021-03-05: 0.4 mg via ORAL
  Filled 2021-03-04: qty 1

## 2021-03-04 NOTE — H&P (Signed)
History and Physical    Joanna [name] BWG:[id number] DOB: 07-14-1966 DOA: 03/04/2021  Referring MD/NP/PA:   PCP: Pcp, No   Patient coming from:  The patient is coming from home.  At baseline, pt is independent for most of ADL.        Chief Complaint: Right-sided weakness, slurred speech, cough  HPI: Joanna [name] is a 55 y.o. female with medical history significant of hypertension, hyperlipidemia, diabetes mellitus, stroke with left-sided weakness, GERD, depression, iron deficiency anemia, tobacco abuse, who presents with right-sided weakness and slurred speech and cough.  Patient states that she has a history of a stroke with mild left-sided weakness, but at about 5 AM, she developed right-sided weakness and slurred speech.  She also has headache and dizziness. She states that she has loss of coordination of the right hand and right leg. Denies numbness or tingling in extremities.  Patient states that she has dry cough and nasal congestion, no shortness of breath or chest pain.  No fever or chills.  Patient states she had a diarrhea in the past 2 days, which has resolved.  Today patient does not have diarrhea, nausea, vomiting or abdominal pain.  No symptoms of UTI.  ED Course: pt was found to have positive COVID-19 test, WBC 5.5, INR 1.0, PTT 33, electrolytes renal function okay, temperature normal, blood pressure 160/79, heart rate 66, RR 18, oxygen saturation 99% on room air. negative chest x-ray.  Patient is placed on MedSurg bed follow-up patient.  Dr. Otelia Limes of neurology is consulted.  CT of head showed: Lacunar infarct within the anterior limb of left internal capsule and small infarct within the right cerebellar hemisphere, new as compared to the brain MRI of 02/24/2019 but otherwise age-indeterminate. Consider a brain MRI for further evaluation.   Redemonstrated chronic infarct within the right pons.   Paranasal sinus disease, as described.  Review of Systems:    General: no fevers, chills, no body weight gain, has fatigue HEENT: no blurry vision, hearing changes or sore throat Respiratory: no dyspnea, has coughing, no wheezing CV: no chest pain, no palpitations GI: no nausea, vomiting, abdominal pain, diarrhea, constipation GU: no dysuria, burning on urination, increased urinary frequency, hematuria  Ext: no leg edema Neuro:  no vision change or hearing loss. Has left-sided weakness from previous stroke.  Has new right-sided weakness and slurred speech Skin: no rash, no skin tear. MSK: No muscle spasm, no deformity, no limitation of range of movement in spin Heme: No easy bruising.  Travel history: No recent long distant travel.  Allergy: No Known Allergies  Past Medical History:  Diagnosis Date   Depression    Diabetes mellitus without complication (HCC)    GERD (gastroesophageal reflux disease)    HLD (hyperlipidemia)    HTN (hypertension)    Iron deficiency anemia    Stroke (HCC)    Tobacco abuse     History reviewed. No pertinent surgical history.  Social History:  reports that she has been smoking cigarettes. She has been smoking an average of 1.00 packs per day. She has never used smokeless tobacco. She reports current alcohol use. She reports that she does not use drugs.  Family History:  Family History  Problem Relation Age of Onset   Diabetes Mellitus II Sister      Prior to Admission medications   Medication Sig Start Date End Date Taking? Authorizing Provider  albuterol (PROVENTIL HFA;VENTOLIN HFA) 108 (90 Base) MCG/ACT inhaler Inhale 2-4 puffs by mouth every  4 hours as needed for wheezing, cough, and/or shortness of breath Patient not taking: Reported on 02/24/2019 05/29/16   Loleta Rose, MD  aspirin EC 81 MG tablet Take 81 mg by mouth daily. 12/29/14   [provider]  atorvastatin (LIPITOR) 40 MG tablet Take 40 mg by mouth daily. 05/04/18   [provider]  buPROPion (WELLBUTRIN SR) 150 MG 12 hr  tablet Take 150 mg by mouth daily. 01/20/15   [provider]  famotidine (PEPCID) 20 MG tablet Take 1 tablet (20 mg total) by mouth 2 (two) times daily for 15 days. 07/22/18 08/06/18  Dionne Bucy, MD  Ferrous Gluconate 325 (36 FE) MG TABS Take 325 mg by mouth 2 (two) times daily. 12/29/14   [provider]  furosemide (LASIX) 20 MG tablet Take 20 mg by mouth daily. 04/27/15   [provider]  gabapentin (NEURONTIN) 100 MG capsule Take 1 capsule (100 mg total) by mouth 3 (three) times daily for 10 days. 11/24/18 12/04/18  Tommi Rumps, PA-C  gabapentin (NEURONTIN) 300 MG capsule Take 2 capsules by mouth 3 (three) times daily. 11/27/18   [provider]  glipiZIDE (GLUCOTROL XL) 10 MG 24 hr tablet Take 10 mg by mouth daily with breakfast.    [provider]  lactulose (CHRONULAC) 10 GM/15ML solution Take 30 mLs (20 g total) by mouth daily as needed for mild constipation. Patient not taking: Reported on 07/22/2018 11/30/17   Irean Hong, MD  lisinopril (PRINIVIL,ZESTRIL) 10 MG tablet Take 10 mg by mouth daily. 07/12/15   [provider]  meloxicam (MOBIC) 15 MG tablet Take 15 mg by mouth daily. 01/25/19   [provider]  metFORMIN (GLUCOPHAGE-XR) 500 MG 24 hr tablet Take 2,000 mg by mouth 2 (two) times a day.  04/27/15   [provider]  metoCLOPramide (REGLAN) 10 MG tablet Take 1 tablet (10 mg total) by mouth every 8 (eight) hours as needed for up to 5 days for nausea or vomiting. 07/22/18 07/27/18  Dionne Bucy, MD  omeprazole (PRILOSEC) 40 MG capsule Take 40 mg by mouth daily. 06/10/18   [provider]  polyethylene glycol (MIRALAX / GLYCOLAX) packet Take 17 g by mouth daily. Patient not taking: Reported on 02/24/2019 11/07/18   Emily Filbert, MD    Physical Exam: Vitals:   03/04/21 1334 03/04/21 1345 03/04/21 1635 03/04/21 1730  BP: (!) 160/79 (!) 160/79 (!) 171/72 (!) 154/73  Pulse: 66 66 73 62   Resp: Temp: 98.7 F (37.1 C)     TempSrc: Oral     SpO2: 100% 99% 100% 95%   General: Not in acute distress HEENT:       Eyes: PERRL, EOMI, no scleral icterus.       ENT: No discharge from the ears and nose, no pharynx injection, no tonsillar enlargement.        Neck: No JVD, no bruit, no mass felt. Heme: No neck lymph node enlargement. Cardiac: S1/S2, RRR, No murmurs, No gallops or rubs. Respiratory: No rales, wheezing, rhonchi or rubs. GI: Soft, nondistended, nontender, no rebound pain, no organomegaly, BS present. GU: No hematuria Ext: No pitting leg edema bilaterally. 1+DP/PT pulse bilaterally. Musculoskeletal: No joint deformities, No joint redness or warmth, no limitation of ROM in spin. Skin: No rashes.  Neuro: Alert, oriented X3, cranial nerves II-XII grossly intact, moves all extremities normally. Muscle strength 5/5 in all extremities, sensation to light touch intact. Brachial reflex 2+  bilaterally. Knee reflex 1+ bilaterally. Negative Babinski's sign. Normal finger to nose test. Psych: Patient is not psychotic, no suicidal or hemocidal ideation.  Labs on Admission: I have personally reviewed following labs and imaging studies  CBC: Recent Labs  Lab 03/04/21 1346  WBC 5.5  NEUTROABS 3.1  HGB 11.2*  HCT 33.5*  MCV 81.1  PLT 261   Basic Metabolic Panel: Recent Labs  Lab 03/04/21 1346  NA 141  K 3.6  CL 109  CO2 24  GLUCOSE 173*  BUN 12  CREATININE 0.74  CALCIUM 9.9   GFR: CrCl cannot be calculated (Unknown ideal weight.). Liver Function Tests: Recent Labs  Lab 03/04/21 1346  AST 17  ALT 13  ALKPHOS 71  BILITOT 0.8  PROT 7.5  ALBUMIN 4.1   No results for input(s): LIPASE, AMYLASE in the last 168 hours. No results for input(s): AMMONIA in the last 168 hours. Coagulation Profile: Recent Labs  Lab 03/04/21 1346  INR 1.0   Cardiac Enzymes: No results for input(s): CKTOTAL, CKMB, CKMBINDEX, TROPONINI in the last 168 hours. BNP  (last 3 results) No results for input(s): PROBNP in the last 8760 hours. HbA1C: No results for input(s): HGBA1C in the last 72 hours. CBG: Recent Labs  Lab 03/04/21 1334 03/04/21 1739  GLUCAP 171* 51*   Lipid Profile: No results for input(s): CHOL, HDL, LDLCALC, TRIG, CHOLHDL, LDLDIRECT in the last 72 hours. Thyroid Function Tests: No results for input(s): TSH, T4TOTAL, FREET4, T3FREE, THYROIDAB in the last 72 hours. Anemia Panel: No results for input(s): VITAMINB12, FOLATE, FERRITIN, TIBC, IRON, RETICCTPCT in the last 72 hours. Urine analysis:    Component Value Date/Time   COLORURINE YELLOW (A) 11/14/2018 0555   APPEARANCEUR CLEAR (A) 11/14/2018 0555   APPEARANCEUR Hazy 06/24/2014 1432   LABSPEC 1.018 11/14/2018 0555   LABSPEC 1.017 06/24/2014 1432   PHURINE 5.0 11/14/2018 0555   GLUCOSEU NEGATIVE 11/14/2018 0555   GLUCOSEU Negative 06/24/2014 1432   HGBUR NEGATIVE 11/14/2018 0555   BILIRUBINUR NEGATIVE 11/14/2018 0555   BILIRUBINUR Negative 06/24/2014 1432   KETONESUR NEGATIVE 11/14/2018 0555   PROTEINUR NEGATIVE 11/14/2018 0555   NITRITE NEGATIVE 11/14/2018 0555   LEUKOCYTESUR NEGATIVE 11/14/2018 0555   LEUKOCYTESUR 2+ 06/24/2014 1432   Sepsis Labs: @LABRCNTIP (procalcitonin:4,lacticidven:4) ) Recent Results (from the past 240 hour(s))  Resp Panel by RT-PCR (Flu A&B, Covid) Nasopharyngeal Swab     Status: Abnormal   Collection Time: 03/04/21  3:36 PM   Specimen: Nasopharyngeal Swab; Nasopharyngeal(NP) swabs in vial transport medium  Result Value Ref Range Status   SARS Coronavirus 2 by RT PCR POSITIVE (A) NEGATIVE Final    Comment: RESULT CALLED TO, READ BACK BY AND VERIFIED WITH: [facility] REGISTER AT 1750 03/04/21.PMF (NOTE) SARS-CoV-2 target nucleic acids are DETECTED.  The SARS-CoV-2 RNA is generally detectable in upper respiratory specimens during the acute phase of infection. Positive results are indicative of the presence of the identified virus, but do not  rule out bacterial infection or co-infection with other pathogens not detected by the test. Clinical correlation with patient history and other diagnostic information is necessary to determine patient infection status. The expected result is Negative.  Fact Sheet for Patients: 05/05/21  Fact Sheet for Healthcare Providers: BloggerCourse.com  This test is not yet approved or cleared by the [place] FDA and  has been authorized for detection and/or diagnosis of SARS-CoV-2 by FDA under an Emergency Use Authorization (EUA).  This EUA will remain in effect (meaning this test can be  used) for the duration of  the COVID-19 declaration under Section 564(b)(1) of the Act, 21 U.S.C. section 360bbb-3(b)(1), unless the authorization is terminated or revoked sooner.     Influenza A by PCR NEGATIVE NEGATIVE Final   Influenza B by PCR NEGATIVE NEGATIVE Final    Comment: (NOTE) The Xpert Xpress SARS-CoV-2/FLU/RSV plus assay is intended as an aid in the diagnosis of influenza from Nasopharyngeal swab specimens and should not be used as a sole basis for treatment. Nasal washings and aspirates are unacceptable for Xpert Xpress SARS-CoV-2/FLU/RSV testing.  Fact Sheet for Patients: BloggerCourse.com  Fact Sheet for Healthcare Providers: [employer]ious[employer]ker[employer]it  This test is not yet approved or cleared by the [place] FDA and has been authorized for detection and/or diagnosis of SARS-CoV-2 by FDA under an Emergency Use Authorization (EUA). This EUA will remain in effect (meaning this test can be used) for the duration of the COVID-19 declaration under Section 564(b)(1) of the Act, 21 U.S.C. section 360bbb-3(b)(1), unless the authorization is terminated or revoked.  Performed at [facility], [address] [place], [place] [address]      Radiological Exams on  Admission: CT HEAD WO CONTRAST  Result Date: 03/04/2021 CLINICAL DATA:  Neuro deficit, acute, stroke suspected; dizziness, nonspecific; headache, classic migraine. Additional history provided: Patient reports loss of coordination in right arm and leg with slurred speech, difficulty balancing, headache, history of stroke. EXAM: CT HEAD WITHOUT CONTRAST TECHNIQUE: Contiguous axial images were obtained from the base of the skull through the vertex without intravenous contrast. COMPARISON:  Brain MRI 02/23/2021 FINDINGS: Brain: Cerebral volume is normal for age. Lacunar infarct within the anterior limb of left internal capsule, new as compared to the brain MRI of 02/24/2019 but otherwise age indeterminate. Small infarct within the right cerebellar hemisphere, also new as compared to the prior brain MRI but otherwise age-indeterminate (series 4, image 45) (series 5, image 28). Known chronic infarct within the right pons. There is no acute intracranial hemorrhage. No demarcated cortical infarct. No extra-axial fluid collection. No evidence of an intracranial mass. No midline shift. Vascular: No hyperdense vessel.  Atherosclerotic calcifications Skull: Normal. Negative for fracture or focal lesion. Sinuses/Orbits: Visualized orbits show no acute finding. Trace left frontal sinus mucosal thickening. Mild mucosal thickening and small volume fluid scattered within the bilateral ethmoid air cells. Trace mucosal thickening within the bilateral sphenoid and maxillary sinuses at the imaged levels. IMPRESSION: Lacunar infarct within the anterior limb of left internal capsule and small infarct within the right cerebellar hemisphere, new as compared to the brain MRI of 02/24/2019 but otherwise age-indeterminate. Consider a brain MRI for further evaluation. Redemonstrated chronic infarct within the right pons. Paranasal sinus disease, as described. Electronically Signed   By: Jackey Loge DO   On: 03/04/2021 14:46   DG Chest Port  1 View  Result Date: 03/04/2021 CLINICAL DATA:  Slurred speech loss of coordination EXAM: PORTABLE CHEST 1 VIEW COMPARISON:  07/22/2018 FINDINGS: The heart size and mediastinal contours are within normal limits. Both lungs are clear. The visualized skeletal structures are unremarkable. IMPRESSION: No active disease. Electronically Signed   By: Jasmine Pang M.D.   On: 03/04/2021 18:44     EKG: I have personally reviewed.  Sinus rhythm, QTC 443, low voltage.   Assessment/Plan Principal Problem:   Stroke [facility]) Active Problems:   GERD (gastroesophageal reflux disease)   Iron deficiency anemia   HTN (hypertension)   HLD (hyperlipidemia)   Diabetes mellitus without complication (HCC)   Depression  COVID-19 virus infection   Stroke [facility](HCC): pt has history of stroke with left-sided weakness, now presents with new right-sided weakness and slurred speech.  CT head showed lacunar infarct within the anterior limb of left internal capsule and small infarct within the right cerebellar hemisphere, new as compared to the brain MRI of 02/24/2019. Dr. Otelia LimesLindzen of neuro is consulted.  -Placed on MedSurg bed for observation - Obtain MRI/MRA  - will hold oral Bp meds to allow permissive HTN in the setting of acute stroke, for SBP>220 or dBP>120 - Check carotid dopplers  - Continue Plavix and lipitor - fasting lipid panel and HbA1c  - 2D transthoracic echocardiography  - swallowing screen. If fails, will get SLP - Check UDS  - PT/OT consult  GERD (gastroesophageal reflux disease): -Protonix - will hold oral Bp meds to allow permissive HTN in the setting of acute stroke,  -IV hydralazine as needed for SBP>220 or dBP>120  HLD (hyperlipidemia) -Lipitor  Diabetes mellitus without complication (HCC): Recent A1c 6.8, well controlled.  Patient taking glipizide and metformin at home -Sliding scale insulin  Depression: Not taking medications currently. -Observe closely  COVID-19 virus infection:  Patient has mild dry cough, no oxygen desaturation.  Chest x-ray negative. -As needed albuterol and Mucinex for symptomatic treatment    DVT ppx: SQ Lovenox Code Status: Full code Family Communication: not done, no family member is at bed side.     Disposition Plan:  Anticipate discharge back to previous environment Consults called:  Dr. Otelia LimesLindzen Admission status and Level of care: Med-Surg:   for obs    Status is: Observation  The patient remains OBS appropriate and will d/c before 2 midnights.  Dispo: The patient is from: Home              Anticipated d/c is to: Home              Patient currently is not medically stable to d/c.   Difficult to place patient No          Date of Service 03/04/2021    Lorretta HarpXilin Celester Lech [facility]   If 7PM-7AM, please contact night-coverage [email or web address] 03/04/2021, 6:59 PM

## 2021-03-04 NOTE — ED Notes (Signed)
Patient transported to MRI 

## 2021-03-04 NOTE — ED Notes (Signed)
Pt c/o headache

## 2021-03-04 NOTE — ED Notes (Signed)
Pt ambulatory to restroom with steady gain and without assistance. Pt provided with specimen cup for UA. Pt returns to room and states that she forgot to provide a sample.

## 2021-03-04 NOTE — ED Notes (Signed)
Pt hypoglycemic with blood sugar of 51. Pt still alert and oriented. Pt provided with meal tray and juice.

## 2021-03-04 NOTE — ED Triage Notes (Addendum)
Pt c/o loss of coordination of the right arm and leg with slurred speech, denies numbness or weakness, off balance and a HA since waking up this morning,, pt has a hx of stroke. States she had some dizziness yesterday,. Pt has weakness to the left side from a previous stroke

## 2021-03-04 NOTE — Consult Note (Addendum)
NEURO HOSPITALIST CONSULT NOTE   Requestig physician: Dr. Clyde LundborgNiu  Reason for Consult: Right arm and leg incoordination and slurred speech  History obtained from:  Patient and Chart     HPI:                                                                                                                                          Joanna [name] is an 55 y.o. female with a prior history of stroke with residual left sided weakness who presents with loss of coordination of her right arm and leg in conjunction with slurred speech. Symptoms began on Thursday evening with some dizziness. She then woke up at 5 AM today with the right sided symptoms. CT head reveals a lacunar infarct within the anterior limb of left internal capsule and small infarct within the right cerebellar hemisphere, which are new as compared to the brain MRI of 02/24/2019 but otherwise age-indeterminate; redemonstrated is a chronic infarct within the right pons.  Her stroke risk factors include DM, HLD, HTN, anemia and tobacco abuse.  Home medications include ASA and atorvastatin.   Past Medical History:  Diagnosis Date   Depression    Diabetes mellitus without complication (HCC)    GERD (gastroesophageal reflux disease)    HLD (hyperlipidemia)    HTN (hypertension)    Iron deficiency anemia    Stroke (HCC)    Tobacco abuse     History reviewed. No pertinent surgical history.  No family history on file.           Social History:  reports that she has been smoking cigarettes. She has been smoking an average of 1.00 packs per day. She has never used smokeless tobacco. She reports current alcohol use. She reports that she does not use drugs.  No Known Allergies  MEDICATIONS:                                                                                                                     No current facility-administered medications on file prior to encounter.   Current Outpatient Medications on File  Prior to Encounter  Medication Sig Dispense Refill   albuterol (PROVENTIL HFA;VENTOLIN HFA) 108 (90 Base) MCG/ACT inhaler Inhale 2-4 puffs by mouth every 4 hours as needed  for wheezing, cough, and/or shortness of breath (Patient not taking: Reported on 02/24/2019) 1 Inhaler 1   aspirin EC 81 MG tablet Take 81 mg by mouth daily.     atorvastatin (LIPITOR) 40 MG tablet Take 40 mg by mouth daily.  11   buPROPion (WELLBUTRIN SR) 150 MG 12 hr tablet Take 150 mg by mouth daily.     famotidine (PEPCID) 20 MG tablet Take 1 tablet (20 mg total) by mouth 2 (two) times daily for 15 days. 30 tablet 0   Ferrous Gluconate 325 (36 FE) MG TABS Take 325 mg by mouth 2 (two) times daily.     furosemide (LASIX) 20 MG tablet Take 20 mg by mouth daily.     gabapentin (NEURONTIN) 100 MG capsule Take 1 capsule (100 mg total) by mouth 3 (three) times daily for 10 days. 30 capsule 0   gabapentin (NEURONTIN) 300 MG capsule Take 2 capsules by mouth 3 (three) times daily.     glipiZIDE (GLUCOTROL XL) 10 MG 24 hr tablet Take 10 mg by mouth daily with breakfast.     lactulose (CHRONULAC) 10 GM/15ML solution Take 30 mLs (20 g total) by mouth daily as needed for mild constipation. (Patient not taking: Reported on 07/22/2018) 120 mL 0   lisinopril (PRINIVIL,ZESTRIL) 10 MG tablet Take 10 mg by mouth daily.     meloxicam (MOBIC) 15 MG tablet Take 15 mg by mouth daily.     metFORMIN (GLUCOPHAGE-XR) 500 MG 24 hr tablet Take 2,000 mg by mouth 2 (two) times a day.      metoCLOPramide (REGLAN) 10 MG tablet Take 1 tablet (10 mg total) by mouth every 8 (eight) hours as needed for up to 5 days for nausea or vomiting. 12 tablet 0   omeprazole (PRILOSEC) 40 MG capsule Take 40 mg by mouth daily.  11   polyethylene glycol (MIRALAX / GLYCOLAX) packet Take 17 g by mouth daily. (Patient not taking: Reported on 02/24/2019) 14 each 0     ROS:                                                                                                                                        Denies numbness or weakness in association with her right sided coordination symptoms. Has had some dizziness. She has felt off balance and also has had a headache since waking up this morning. She has chronic left sided weakness from a previous stroke. Other ROS as per HPI.    Blood pressure (!) 160/79, pulse 66, temperature 98.7 F (37.1 C), temperature source Oral, resp. rate 18, SpO2 99 %.   General Examination:  Physical Exam  HEENT-  [place]/AT  Lungs- Respirations unlabored Extremities- No edema  Neurological Examination Mental Status: Awake and alert. Fully oriented. Subtle dysarthria intermittently noted. Speech fluent without evidence of aphasia.  Able to follow all commands without difficulty. Cranial Nerves: II: Temporal visual fields intact with no extinction to DSS. PERRL.   III,IV, VI: No ptosis. EOMI. No nystagmus.  V,VII: Temp sensation equal bilaterally. Subtly decreased right NL fold.  VIII: Hearing intact to voice.  IX,X: No hypophonia XI: Symmetric XII: Midline tongue extension Motor: Right : Upper extremity   5/5    Left:     Upper extremity   5/5  Lower extremity   5/5     Lower extremity   5/5 No pronator drift.  Positive orbiting fingers test on the right.  Sensory: Decreased temp sensation to LUE. Temp sensation otherwise intact. FT intact x 4. No extinction to DSS.  Deep Tendon Reflexes: 2+ and symmetric throughout, except for 4+ left patellar (crossed adductor response on the left).  Plantars: Right: downgoing   Left: downgoing Cerebellar: No ataxia with FNF bilaterally Gait: Deferred   Lab Results: Basic Metabolic Panel: Recent Labs  Lab 03/04/21 1346  NA 141  K 3.6  CL 109  CO2 24  GLUCOSE 173*  BUN 12  CREATININE 0.74  CALCIUM 9.9    CBC: Recent Labs  Lab 03/04/21 1346  WBC 5.5  NEUTROABS 3.1  HGB 11.2*  HCT  33.5*  MCV 81.1  PLT 261    Cardiac Enzymes: No results for input(s): CKTOTAL, CKMB, CKMBINDEX, TROPONINI in the last 168 hours.  Lipid Panel: No results for input(s): CHOL, TRIG, HDL, CHOLHDL, VLDL, LDLCALC in the last 168 hours.  Imaging: CT HEAD WO CONTRAST  Result Date: 03/04/2021 CLINICAL DATA:  Neuro deficit, acute, stroke suspected; dizziness, nonspecific; headache, classic migraine. Additional history provided: Patient reports loss of coordination in right arm and leg with slurred speech, difficulty balancing, headache, history of stroke. EXAM: CT HEAD WITHOUT CONTRAST TECHNIQUE: Contiguous axial images were obtained from the base of the skull through the vertex without intravenous contrast. COMPARISON:  Brain MRI 02/23/2021 FINDINGS: Brain: Cerebral volume is normal for age. Lacunar infarct within the anterior limb of left internal capsule, new as compared to the brain MRI of 02/24/2019 but otherwise age indeterminate. Small infarct within the right cerebellar hemisphere, also new as compared to the prior brain MRI but otherwise age-indeterminate (series 4, image 45) (series 5, image 28). Known chronic infarct within the right pons. There is no acute intracranial hemorrhage. No demarcated cortical infarct. No extra-axial fluid collection. No evidence of an intracranial mass. No midline shift. Vascular: No hyperdense vessel.  Atherosclerotic calcifications Skull: Normal. Negative for fracture or focal lesion. Sinuses/Orbits: Visualized orbits show no acute finding. Trace left frontal sinus mucosal thickening. Mild mucosal thickening and small volume fluid scattered within the bilateral ethmoid air cells. Trace mucosal thickening within the bilateral sphenoid and maxillary sinuses at the imaged levels. IMPRESSION: Lacunar infarct within the anterior limb of left internal capsule and small infarct within the right cerebellar hemisphere, new as compared to the brain MRI of 02/24/2019 but otherwise  age-indeterminate. Consider a brain MRI for further evaluation. Redemonstrated chronic infarct within the right pons. Paranasal sinus disease, as described. Electronically Signed   By: Jackey Loge DO   On: 03/04/2021 14:46     Assessment: 55 year old female with a prior history of stroke with residual left sided weakness who presents with loss of  coordination of her right arm and leg in conjunction with slurred speech.  1. Exam reveals subtle motor deficit involving the RUE and slightly decreased right NL fold with subtle dysarthria.  2. DDx includes acute infarct of the left IC versus thalamus. Most likely etiology is chronic HTN.  3. Stroke risk factors: DM, HLD, HTN, anemia, prior stroke and tobacco abuse. 4. Out of the tPA time window. LKN was Thursday evening.  5. Not an endovascular candidate. Signs and symptoms are not consistent with LVO.   Recommendations: 1. Add Plavix to ASA. Continue atorvastatin.  2. MRI brain and MRA head. The patient states that she will need Ativan for MRI. 3. Carotid ultrasound 4. TTE 5. Cardiac telemetry 6. PT/OT/Speech 7. Permissive HTN x 24 hours.    Electronically signed: Dr. Caryl Pina 03/04/2021, 4:01 PM

## 2021-03-04 NOTE — ED Provider Notes (Signed)
[facility] Emergency Department Provider Note   ____________________________________________   Event Date/Time   First MD Initiated Contact with Patient 03/04/21 1524     (approximate)  I have reviewed the triage vital signs and the nursing notes.   HISTORY  Chief Complaint Weakness and Stroke Symptoms    HPI Joanna [name] is a 55 y.o. female presents for slurred speech and right-sided weakness/uncoordination  LOCATION: Right upper and lower extremity DURATION: 24 hours TIMING: Slightly improved since onset SEVERITY: Moderate QUALITY: Weakness and uncoordination CONTEXT: Patient states that she first noticed the symptoms upon waking up yesterday.  Patient states that she is right-handed and has been unable to write as she normally could before yesterday.  Patient also states that she walks with a cane but has been unable to ambulate since yesterday due to uncoordination her right leg MODIFYING FACTORS: Denies any exacerbating or relieving factors ASSOCIATED SYMPTOMS: Headache   Per medical record review patient had a history of stroke in 2020 with residual left-sided deficits          Past Medical History:  Diagnosis Date   Diabetes mellitus without complication (HCC)    Stroke [facility](HCC)     Patient Active Problem List   Diagnosis Date Noted   CVA (cerebral vascular accident) (HCC) 02/24/2019    History reviewed. No pertinent surgical history.  Prior to Admission medications   Medication Sig Start Date End Date Taking? Authorizing Provider  albuterol (PROVENTIL HFA;VENTOLIN HFA) 108 (90 Base) MCG/ACT inhaler Inhale 2-4 puffs by mouth every 4 hours as needed for wheezing, cough, and/or shortness of breath Patient not taking: Reported on 02/24/2019 05/29/16   Loleta RoseForbach, Cory, MD  aspirin EC 81 MG tablet Take 81 mg by mouth daily. 12/29/14   [provider]  atorvastatin (LIPITOR) 40 MG tablet Take 40 mg by mouth daily. 05/04/18    [provider]  buPROPion (WELLBUTRIN SR) 150 MG 12 hr tablet Take 150 mg by mouth daily. 01/20/15   [provider]  famotidine (PEPCID) 20 MG tablet Take 1 tablet (20 mg total) by mouth 2 (two) times daily for 15 days. 07/22/18 08/06/18  Dionne BucySiadecki, Sebastian, MD  Ferrous Gluconate 325 (36 FE) MG TABS Take 325 mg by mouth 2 (two) times daily. 12/29/14   [provider]  furosemide (LASIX) 20 MG tablet Take 20 mg by mouth daily. 04/27/15   [provider]  gabapentin (NEURONTIN) 100 MG capsule Take 1 capsule (100 mg total) by mouth 3 (three) times daily for 10 days. 11/24/18 12/04/18  Tommi RumpsSummers, Rhonda L, PA-C  gabapentin (NEURONTIN) 300 MG capsule Take 2 capsules by mouth 3 (three) times daily. 11/27/18   [provider]  glipiZIDE (GLUCOTROL XL) 10 MG 24 hr tablet Take 10 mg by mouth daily with breakfast.    [provider]  lactulose (CHRONULAC) 10 GM/15ML solution Take 30 mLs (20 g total) by mouth daily as needed for mild constipation. Patient not taking: Reported on 07/22/2018 11/30/17   Irean HongSung, Jade J, MD  lisinopril (PRINIVIL,ZESTRIL) 10 MG tablet Take 10 mg by mouth daily. 07/12/15   [provider]  meloxicam (MOBIC) 15 MG tablet Take 15 mg by mouth daily. 01/25/19   [provider]  metFORMIN (GLUCOPHAGE-XR) 500 MG 24 hr tablet Take 2,000 mg by mouth 2 (two) times a day.  04/27/15   [provider]  metoCLOPramide (REGLAN) 10 MG tablet Take 1 tablet (10 mg total) by mouth every 8 (eight) hours  as needed for up to 5 days for nausea or vomiting. 07/22/18 07/27/18  Dionne Bucy, MD  omeprazole (PRILOSEC) 40 MG capsule Take 40 mg by mouth daily. 06/10/18   [provider]  polyethylene glycol (MIRALAX / GLYCOLAX) packet Take 17 g by mouth daily. Patient not taking: Reported on 02/24/2019 11/07/18   Emily Filbert, MD    Allergies Patient has no known allergies.  No family history on file.  Social  History Social History   Tobacco Use   Smoking status: Every Day    Packs/day: 1.00    Pack years: 0.00    Types: Cigarettes   Smokeless tobacco: Never  Substance Use Topics   Alcohol use: Yes    Comment: occ   Drug use: Never    Review of Systems Constitutional: No fever/chills Eyes: No visual changes. ENT: No sore throat. Cardiovascular: Denies chest pain. Respiratory: Denies shortness of breath. Gastrointestinal: No abdominal pain.  No nausea, no vomiting.  No diarrhea. Genitourinary: Negative for dysuria. Musculoskeletal: Negative for acute arthralgias Skin: Negative for rash. Neurological: Endorses left upper and lower extremity weakness with associated uncoordination.  Negative for headaches, numbness/paresthesias in any extremity Psychiatric: Negative for suicidal ideation/homicidal ideation   ____________________________________________   PHYSICAL EXAM:  VITAL SIGNS: ED Triage Vitals [03/04/21 1334]  Enc Vitals Group     BP (!) 160/79     Pulse Rate 66     Resp 16     Temp 98.7 F (37.1 C)     Temp Source Oral     SpO2 100 %     Weight      Height      Head Circumference      Peak Flow      Pain Score      Pain Loc      Pain Edu?      Excl. in GC?    Constitutional: Alert and oriented. Well appearing and in no acute distress. Eyes: Conjunctivae are normal. PERRL. Head: Atraumatic. Nose: No congestion/rhinnorhea. Mouth/Throat: Mucous membranes are moist. Neck: No stridor Cardiovascular: Grossly normal heart sounds.  Good peripheral circulation. Respiratory: Normal respiratory effort.  No retractions. Gastrointestinal: Soft and nontender. No distention. Musculoskeletal: No obvious deformities Neurologic:  Normal speech and language.  Right upper extremity drift.  Ataxic gait.  Finger-to-nose with difficulty on the right Skin:  Skin is warm and dry. No rash noted. Psychiatric: Mood and affect are normal. Speech and behavior are  normal.  ____________________________________________   LABS (all labs ordered are listed, but only abnormal results are displayed)  Labs Reviewed  CBC - Abnormal; Notable for the following components:      Result Value   Hemoglobin 11.2 (*)    HCT 33.5 (*)    All other components within normal limits  COMPREHENSIVE METABOLIC PANEL - Abnormal; Notable for the following components:   Glucose, Bld 173 (*)    All other components within normal limits  CBG MONITORING, ED - Abnormal; Notable for the following components:   Glucose-Capillary 171 (*)    All other components within normal limits  PROTIME-INR  APTT  DIFFERENTIAL  I-STAT CREATININE, ED   ____________________________________________  EKG  ED ECG REPORT I, Merwyn Katos, the attending physician, personally viewed and interpreted this ECG.  Date: 03/04/2021 EKG Time: 1339 Rate: 65 Rhythm: normal sinus rhythm QRS Axis: normal Intervals: normal ST/T Wave abnormalities: normal Narrative Interpretation: no evidence of acute ischemia  ____________________________________________  RADIOLOGY  ED MD interpretation: CT  of the head without contrast shows a lacunar infarct within the anterior limb of the internal capsule on the left as well as a small infarct in the right cerebellar hemisphere that is new compared to the MRI done in 2020  Official radiology report(s): CT HEAD WO CONTRAST  Result Date: 03/04/2021 CLINICAL DATA:  Neuro deficit, acute, stroke suspected; dizziness, nonspecific; headache, classic migraine. Additional history provided: Patient reports loss of coordination in right arm and leg with slurred speech, difficulty balancing, headache, history of stroke. EXAM: CT HEAD WITHOUT CONTRAST TECHNIQUE: Contiguous axial images were obtained from the base of the skull through the vertex without intravenous contrast. COMPARISON:  Brain MRI 02/23/2021 FINDINGS: Brain: Cerebral volume is normal for age. Lacunar  infarct within the anterior limb of left internal capsule, new as compared to the brain MRI of 02/24/2019 but otherwise age indeterminate. Small infarct within the right cerebellar hemisphere, also new as compared to the prior brain MRI but otherwise age-indeterminate (series 4, image 45) (series 5, image 28). Known chronic infarct within the right pons. There is no acute intracranial hemorrhage. No demarcated cortical infarct. No extra-axial fluid collection. No evidence of an intracranial mass. No midline shift. Vascular: No hyperdense vessel.  Atherosclerotic calcifications Skull: Normal. Negative for fracture or focal lesion. Sinuses/Orbits: Visualized orbits show no acute finding. Trace left frontal sinus mucosal thickening. Mild mucosal thickening and small volume fluid scattered within the bilateral ethmoid air cells. Trace mucosal thickening within the bilateral sphenoid and maxillary sinuses at the imaged levels. IMPRESSION: Lacunar infarct within the anterior limb of left internal capsule and small infarct within the right cerebellar hemisphere, new as compared to the brain MRI of 02/24/2019 but otherwise age-indeterminate. Consider a brain MRI for further evaluation. Redemonstrated chronic infarct within the right pons. Paranasal sinus disease, as described. Electronically Signed   By: Jackey Loge DO   On: 03/04/2021 14:46    ____________________________________________   PROCEDURES  Procedure(s) performed (including Critical Care):  .Critical Care  Date/Time: 03/04/2021 5:03 PM Performed by: Merwyn Katos, MD Authorized by: Merwyn Katos, MD   Critical care provider statement:    Critical care time (minutes):  41   Critical care time was exclusive of:  Separately billable procedures and treating other patients   Critical care was necessary to treat or prevent imminent or life-threatening deterioration of the following conditions:  CNS failure or compromise   Critical care was time  spent personally by me on the following activities:  Discussions with consultants, evaluation of patient's response to treatment, examination of patient, ordering and performing treatments and interventions, ordering and review of laboratory studies, ordering and review of radiographic studies, pulse oximetry, re-evaluation of patient's condition, obtaining history from patient or surrogate and review of old charts   I assumed direction of critical care for this patient from another provider in my specialty: no     Care discussed with: admitting provider   .1-3 Lead EKG Interpretation  Date/Time: 03/04/2021 5:04 PM Performed by: Merwyn Katos, MD Authorized by: Merwyn Katos, MD     Interpretation: normal     ECG rate:  72   ECG rate assessment: normal     Rhythm: sinus rhythm     Ectopy: none     Conduction: normal     ____________________________________________   INITIAL IMPRESSION / ASSESSMENT AND PLAN / ED COURSE  As part of my medical decision making, I reviewed the following data within the electronic medical record, if available:  Nursing notes reviewed and incorporated, Labs reviewed, EKG interpreted, Old chart reviewed, Radiograph reviewed and Notes from prior ED visits reviewed and incorporated      Patient is a 55 year old female who presents with symptoms concerning for CVA/TIA PMH risk factors: Previous CVA in 2020 Neurologic Deficits: Right upper and lower extremity weakness as well as abnormal proprioception Last known Well Time: 03/02/2021 NIH Stroke Score: 6 Given History and Exam I have lower suspicion for infectious etiology, neurologic changes secondary to toxicologic ingestion, seizure, complex migraine. Presentation concerning for possible stroke requiring workup.  Workup: Labs: POC glucose, CBC, BMP, LFTs, Troponin, PT/INR, PTT, Type and Screen Other Diagnostics: ECG, CXR, non-contrast head CT  Interventions: Patient's not eligible for TPA due time since  last known well  Consult: hospitalist Disposition: Admit      ____________________________________________   FINAL CLINICAL IMPRESSION(S) / ED DIAGNOSES  Final diagnoses:  None     ED Discharge Orders     None        Note:  This document was prepared using Dragon voice recognition software and may include unintentional dictation errors.    Merwyn Katos, MD 03/04/21 [phone]

## 2021-03-05 ENCOUNTER — Observation Stay
Admit: 2021-03-05 | Discharge: 2021-03-05 | Disposition: A | Discharge status: Home / Self Care | Payer: Medicaid Other | Attending: Internal Medicine | Admitting: Internal Medicine

## 2021-03-05 ENCOUNTER — Other Ambulatory Visit: Payer: Self-pay

## 2021-03-05 DIAGNOSIS — I639 Cerebral infarction, unspecified: Secondary | ICD-10-CM

## 2021-03-05 LAB — LIPID PANEL
Cholesterol: 107 mg/dL (ref 0–200)
HDL: 22 mg/dL — ABNORMAL LOW (ref >40)
LDL Cholesterol: 61 mg/dL (ref 0–99)
Total CHOL/HDL Ratio: 4.9 RATIO
Triglycerides: 119 mg/dL (ref <150)
VLDL: 24 mg/dL (ref 0–40)

## 2021-03-05 LAB — ECHOCARDIOGRAM COMPLETE
AR max vel: 2.45 cm2
AV Peak grad: 6.6 mmHg
Ao pk vel: 1.28 m/s
Area-P 1/2: 3.48 cm2
S' Lateral: 2.75 cm

## 2021-03-05 LAB — HIV ANTIBODY (ROUTINE TESTING W REFLEX): HIV Screen 4th Generation wRfx: NONREACTIVE

## 2021-03-05 LAB — CBG MONITORING, ED
Glucose-Capillary: 59 mg/dL — ABNORMAL LOW (ref 70–99)
Glucose-Capillary: 169 mg/dL — ABNORMAL HIGH (ref 70–99)

## 2021-03-05 NOTE — ED Notes (Addendum)
MD Lolita Patella made aware patient has become verbally aggressive, demanding to leave, and ripping all her monitor cords off. PAtient made aware this RN was going to message MD about her wanting to leave AMA.   Upon entering the room patient was sitting on bench against wall and not on stretcher hooked up to monitor.  Relayed to patient MD would like her to stay and speak with neuro before getting discharged otherwise it would be AMA. Patient asked this RN to take out IV so she could leave.  Patient also refused to sign AMA form. Stated "I am not signing anything for yall"  IV removed for patient.

## 2021-03-05 NOTE — Progress Notes (Signed)
OT Cancellation Note  Patient Details Name: Joanna [name] MRN: [id number] DOB: [birth year]   Cancelled Treatment:    Reason Eval/Treat Not Completed: OT screened, no needs identified, will sign off. Consult received, chart reviewed, and spoke with PT. Pt reporting she feels back to her baseline with no numbness/tingling and ongoing residual L hemiparesis. No skilled acute OT needs at this time. Will sign off.   Wynona Canes, MPH, MS, OTR/L ascom [phone] 03/05/21, 12:19 PM

## 2021-03-05 NOTE — Progress Notes (Signed)
SLP Cancellation Note  Patient Details Name: Joanna [name] MRN: [id number] DOB: 23-Jun-1966   Cancelled treatment:       Reason Eval/Treat Not Completed: SLP screened, no needs identified, will sign off (chart reviewed; consulted NSG then met w/ pt).  Pt denied any difficulty swallowing and is currently on a regular diet; tolerates swallowing pills w/ water per NSG. Pt conversed at conversational level w/out overt deficits noted; pt and denied any speech-language deficits. Pt stated she "just wanted to go home as soon as I can". Noted MRI results - negative for acute changes, likely prior infarcts.  No further skilled ST services indicated as pt appears at her baseline. Pt agreed. NSG to reconsult if any change in status.       Orinda Kenner, MS, CCC-SLP Speech Language Pathologist Rehab Services [phone] [facility] 03/05/2021, 10:17 AM

## 2021-03-05 NOTE — Discharge Summary (Signed)
AMA discharge summary  Patient was placed on observation status given concern for acute CVA.  When I saw and evaluated patient this morning she stated that she was back to normal would like to leave.  I explained that we are still in the process of completing her full neurologic work-up.  At the time of my evaluation she is calm and cooperative however in the early afternoon I received a note from bedside RN that patient was becoming verbally aggressive and ripping off her telemetry leads and demanding to leave.  I relayed to bedside RN via secure chat that in order for me to consider discharge patient will need to be evaluated by neurology.  This information was relayed to patient however she still demanded to have her IV line taken out and wanted to leave.  She also refused to sign AMA forms.  Patient left AMA at approximately 1210  Lolita Patella MD

## 2021-03-05 NOTE — ED Notes (Signed)
Patient became angry when going over insulin ordered for her blood sugar. Patient refused insulin and stated "I do not take shots! I will take my own meds"

## 2021-03-05 NOTE — Progress Notes (Signed)
*  PRELIMINARY RESULTS* Echocardiogram 2D Echocardiogram has been performed.  Lenor Coffin 03/05/2021, 9:49 AM

## 2021-03-05 NOTE — Evaluation (Signed)
Physical Therapy Evaluation Patient Details Name: Joanna [name] MRN: [id number] DOB: [birth year] Today's Date: 03/05/2021   History of Present Illness  Pt is a 55 y/o F admitted on 03/04/21 with c/c of impaired coordination RUE/RLE & slurred speech. Pt also with c/o dry cough, nasal congestion, and recent diarrhea; pt found to be COVID (+). CT revealed lacunar infarct within the anterior limb of left internal capsule and small infarct within the right cerebellar hemisphere while MRI revealed no acute intracranial abnormality. PMH: DM, stroke, HTN, HLD, GERD, depression, tobacco abuse  Clinical Impression  Pt seen for PT evaluation with pt reporting she feels back to her baseline with no numbness/tingling and ongoing residual L hemiparesis. Pt reports she intermittently ambulates with SPC at home but falls at least 1x/week & has difficulty getting out of bathtub after bathing. PT educates pt on benefits of use of RW and TTB to decrease fall risk & pt agreeable. Pt also open to HHPT for balance training & to reduce fall risk. PT educates pt on increased risk of stroke & encouraged her to maintain healthy diet, take medications as prescribed by MD, and to quit smoking & drinking (if applicable). Pt is able to ambulate to door & back in room x 2 without AD & supervision with only 1 LOB. Pt is eager to d/c home. Will continue to see pt acutely to address high level balance & gait with LRAD while she remains in hospital.      Follow Up Recommendations Home health PT    Equipment Recommendations  Rolling walker with 5" wheels (tub bench)    Recommendations for Other Services       Precautions / Restrictions Precautions Precautions: Fall Restrictions Weight Bearing Restrictions: No      Mobility  Bed Mobility Overal bed mobility: Modified Independent                  Transfers Overall transfer level: Independent                  Ambulation/Gait Ambulation/Gait assistance:  Supervision Gait Distance (Feet): 20 Feet (+ 20) Assistive device: None Gait Pattern/deviations: Decreased dorsiflexion - left;Decreased step length - left;Decreased stride length Gait velocity: decreased   General Gait Details: 1 mild LOB but pt able to correct, holds to object in room for support. Pt with decreased L hip/knee flexion during swing phase & decreased L heel strike.  Stairs            Wheelchair Mobility    Modified Rankin (Stroke Patients Only)       Balance Overall balance assessment: Needs assistance   Sitting balance-Leahy Scale: Good       Standing balance-Leahy Scale: Fair                               Pertinent Vitals/Pain Pain Assessment: No/denies pain    Home Living Family/patient expects to be discharged to:: Private residence Living Arrangements: Children (49 y/o grandson) Available Help at Discharge: Available PRN/intermittently;Family;Friend(s) Type of Home: House Home Access: Stairs to enter Entrance Stairs-Rails: Right Entrance Stairs-Number of Steps: 2 Home Layout: One level Home Equipment: Walker - standard;Cane - single point      Prior Function Level of Independence: Needs assistance   Gait / Transfers Assistance Needed: Pt ambulates with intermittent use of SPC or without AD. Pt endorses 1 fall a week.     Comments: Friends drive her to  appointments. Pt with residual L hemiparesis.     Hand Dominance   Dominant Hand: Right    Extremity/Trunk Assessment   Upper Extremity Assessment Upper Extremity Assessment: LUE deficits/detail (RUE WNL, LUE rapid alternating movements slow & uncoordinated (pt with hx of residual L hemiparesis))    Lower Extremity Assessment Lower Extremity Assessment: LLE deficits/detail (RLE WNL, LLE heel to shin slow compared to RLE (pt with hx of residual L hemiparesis))       Communication   Communication: No difficulties  Cognition Arousal/Alertness: Awake/alert Behavior  During Therapy: WFL for tasks assessed/performed Overall Cognitive Status: Within Functional Limits for tasks assessed                                        General Comments      Exercises     Assessment/Plan    PT Assessment Patient needs continued PT services  PT Problem List Decreased strength;Decreased mobility;Decreased activity tolerance;Decreased balance;Decreased knowledge of use of DME;Decreased coordination       PT Treatment Interventions DME instruction;Therapeutic exercise;Gait training;Balance training;Stair training;Neuromuscular re-education;Functional mobility training;Therapeutic activities;Patient/family education    PT Goals (Current goals can be found in the Care Plan section)  Acute Rehab PT Goals Patient Stated Goal: go home PT Goal Formulation: With patient Time For Goal Achievement: 03/19/21 Potential to Achieve Goals: Good    Frequency Min 2X/week   Barriers to discharge Decreased caregiver support lives alone with 41 y/o grandson    Co-evaluation               AM-PAC PT "6 Clicks" Mobility  Outcome Measure Help needed turning from your back to your side while in a flat bed without using bedrails?: None Help needed moving from lying on your back to sitting on the side of a flat bed without using bedrails?: None Help needed moving to and from a bed to a chair (including a wheelchair)?: None Help needed standing up from a chair using your arms (e.g., wheelchair or bedside chair)?: None Help needed to walk in hospital room?: A Little Help needed climbing 3-5 steps with a railing? : A Little 6 Click Score: 22    End of Session   Activity Tolerance: Patient tolerated treatment well Patient left: in bed;with call bell/phone within reach Nurse Communication: Mobility status PT Visit Diagnosis: Unsteadiness on feet (R26.81);Muscle weakness (generalized) (M62.81);History of falling (Z91.81)    Time: 1017-5102 PT Time  Calculation (min) (ACUTE ONLY): 10 min   Charges:   PT Evaluation $PT Eval Low Complexity: 1 Low          Aleda Grana, PT, DPT 03/05/21, 11:49 AM   Sandi Mariscal 03/05/2021, 11:46 AM

## 2021-03-05 NOTE — Progress Notes (Signed)
S:  The patient became agitated this morning and stated she wanted to leave AMA.   O: BP 116/78 (BP Location: Right Arm)   Pulse 65   Temp 98 F (36.7 C) (Oral)   Resp 16   SpO2 98%   Neurological exam: Unable to assess prior to patient leaving AMA.   MRI brain:  -No acute intracranial abnormality.  -Hyperintense T2-weighted signal in the right middle cerebellar peduncle and right pons, likely prior infarcts.  MRA head: Normal.  TTE: Completed with report pending  Carotid ultrasound:  -Approximately 50% stenosis of the left internal carotid artery.  -No stenosis of the right internal carotid artery. -Antegrade flow in the vertebral arteries.  Assessment/Recommendations: 55 year old female with a prior history of stroke with residual left sided weakness who presented with loss of coordination of her right arm and leg in conjunction with slurred speech. 1. Exam revealed subtle motor deficit involving the RUE and slightly decreased right NL fold with subtle dysarthria. 2. MRI did not show an acute infarction. Old right middle cerebellar peduncle and right pons infarcts were noted. Images reviewed by Neurology. It is felt that given how mild/subtle the deficits were on exam, a tiny ischemic infarction may have occurred, which may be too small to detect with MRI.  3. Stroke risk factors: DM, HLD, HTN, anemia, prior stroke and tobacco abuse. 4. Carotid ultrasound with 50% stenosis of left ICA. This is the symptomatic side (contralateral to her presenting deficits). Vascular Surgery consult is indicated.  5. MRA head: Negative.  6. TTE completed with report pending.   Recommendations: 1. Has been switched to Plavix from ASA. Continue atorvastatin. 2. Cardiac telemetry 3. PT/OT/Speech 4. BP management. Now out of the permissive HTN time window.  5. Vascular Surgery consult.   The patient left AMA prior to Neurology follow up assessment.   Electronically signed: Dr. Caryl Pina

## 2021-03-08 LAB — HEMOGLOBIN A1C
Hgb A1c MFr Bld: 6.6 % — ABNORMAL HIGH (ref 4.8–5.6)
Mean Plasma Glucose: 143 mg/dL

## 2023-02-18 IMAGING — DX DG CHEST 1V PORT
1 series · 1 of 1 positions shown · non-contrast
Comparison: 07/22/2018

CLINICAL DATA: Slurred speech loss of coordination

EXAM:
PORTABLE CHEST 1 VIEW

[chest ap]
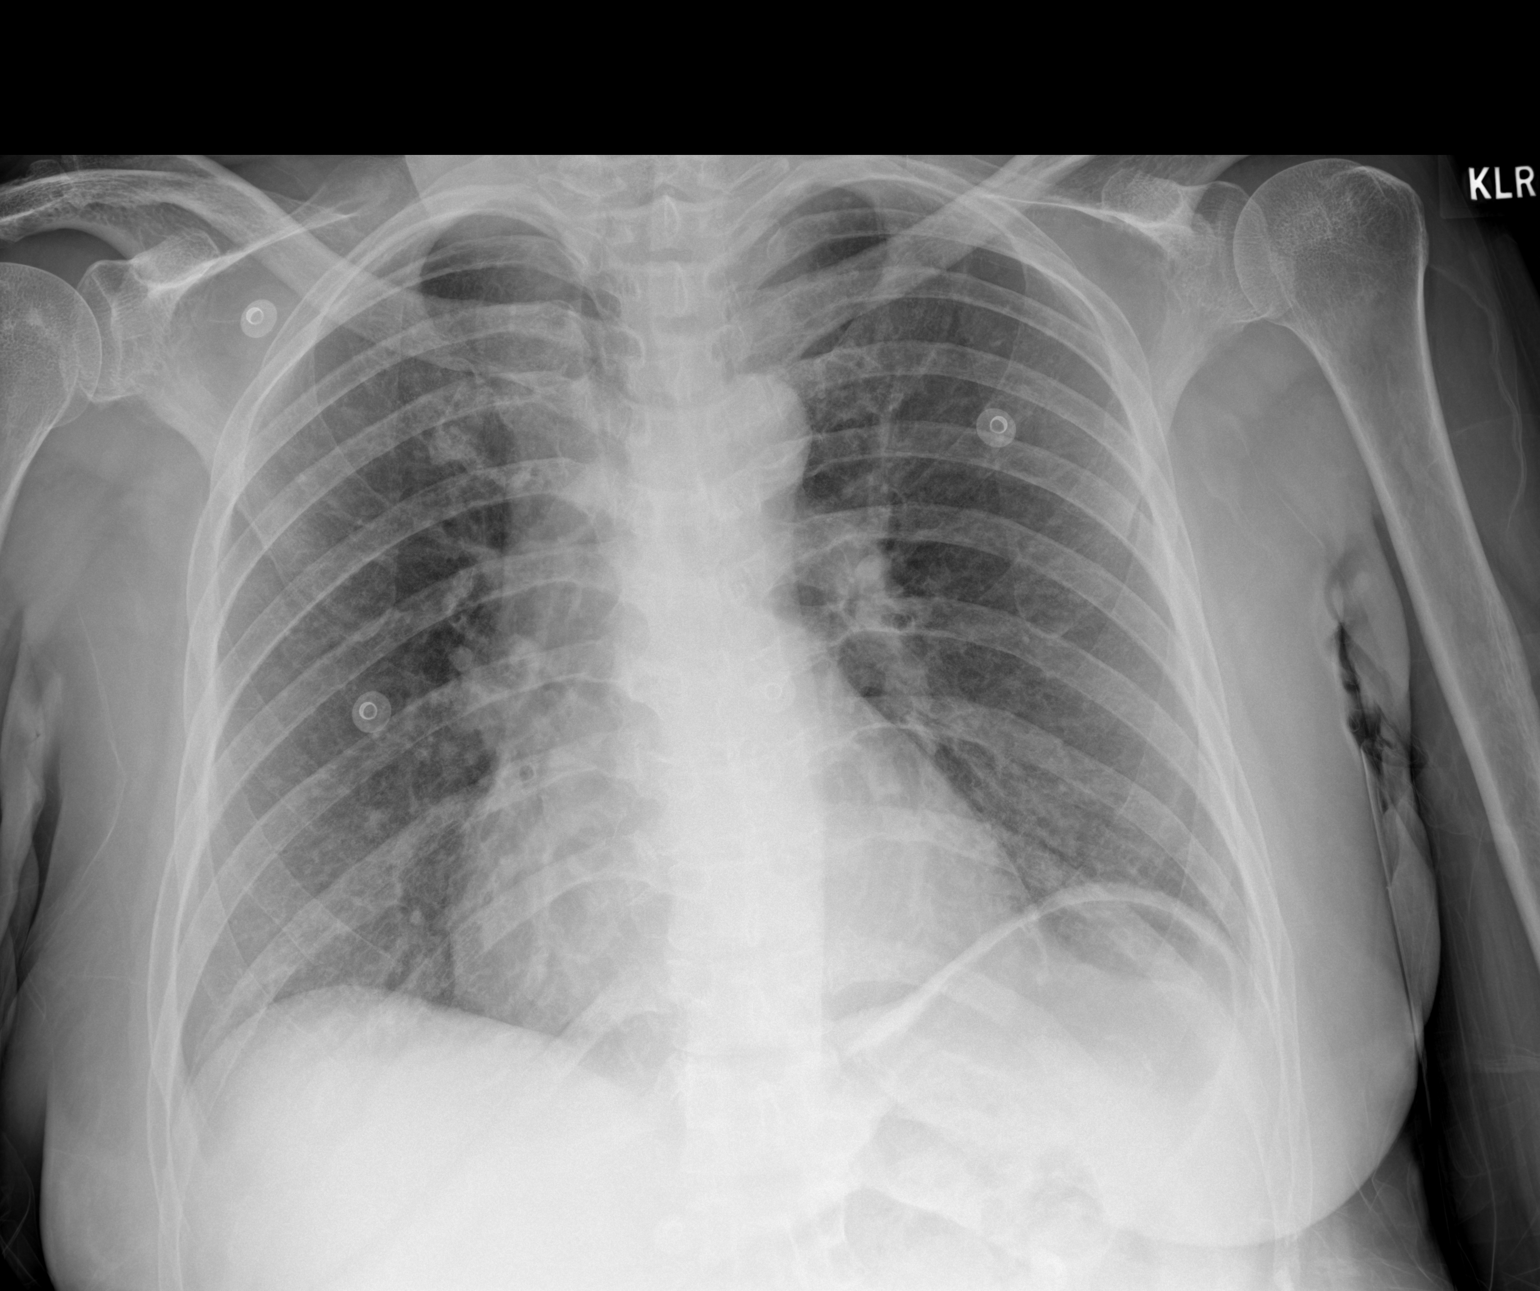

[1 of 1 positions shown; findings below may reference images not displayed]

FINDINGS: The heart size and mediastinal contours are within normal limits.
Both lungs are clear. The visualized skeletal structures are
unremarkable.
IMPRESSION: No active disease.

## 2023-02-18 IMAGING — CT CT HEAD W/O CM
3 series · 14 of 47 positions shown, 16 images · non-contrast
Comparison: Brain MRI 02/23/2021

CLINICAL DATA: Neuro deficit, acute, stroke suspected; dizziness,
nonspecific; headache, classic migraine. Additional history
provided: Patient reports loss of coordination in right arm and leg
with slurred speech, difficulty balancing, headache, history of
stroke.

EXAM:
CT HEAD WITHOUT CONTRAST
TECHNIQUE: Contiguous axial images were obtained from the base of the skull
through the vertex without intravenous contrast.

[Series 2: head wo · axial · 0.56mm/px · z∈[+428,+553]mm · 8 of 31 slices shown, 10 images]
[im 3/31  brain]
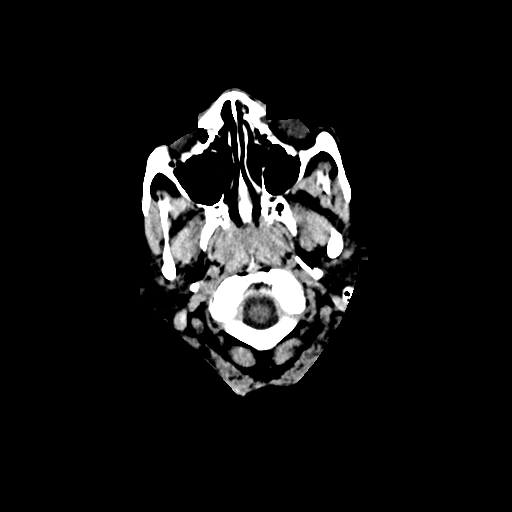
[im 3/31  bone]
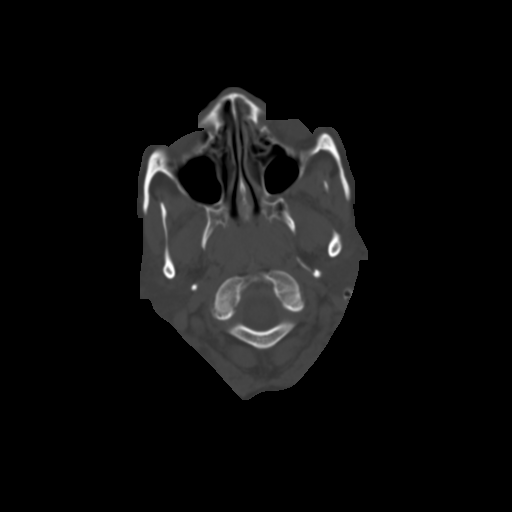
[im 7/31  brain]
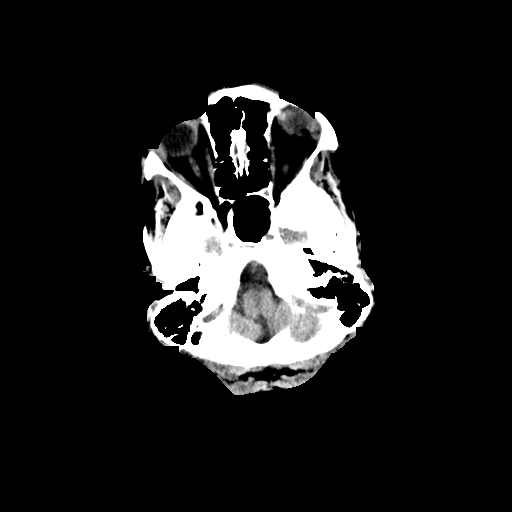
[im 10/31  brain]
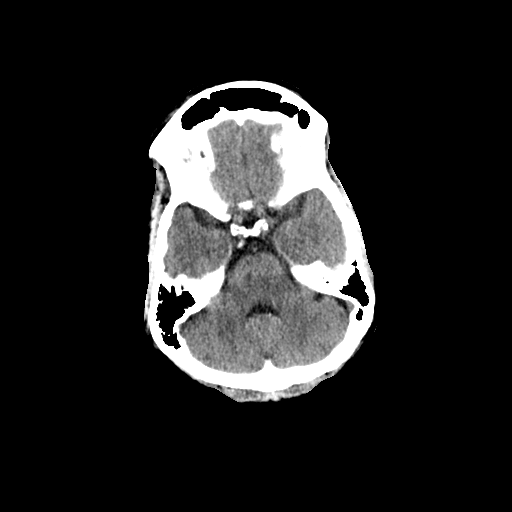
[im 14/31  brain]
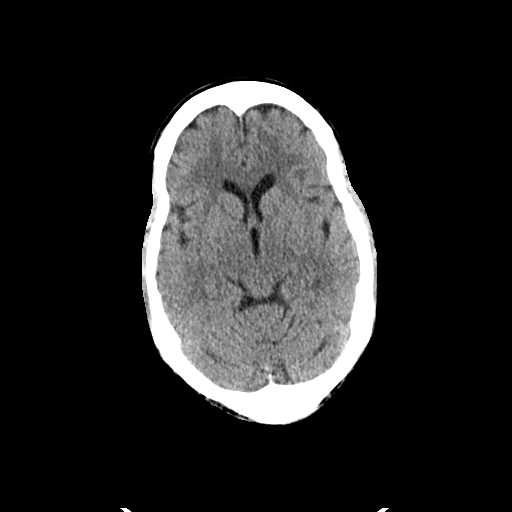
[im 17/31  brain]
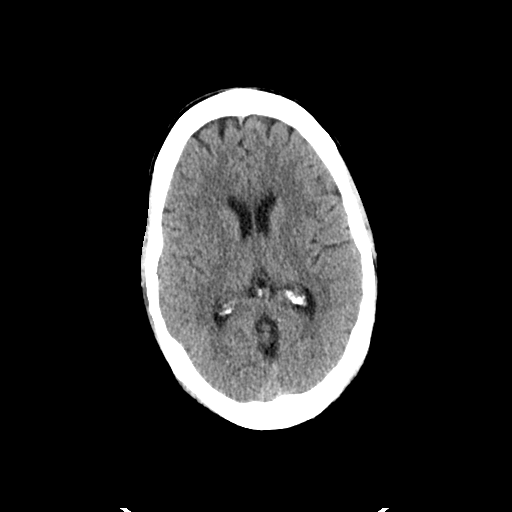
[im 17/31  bone]
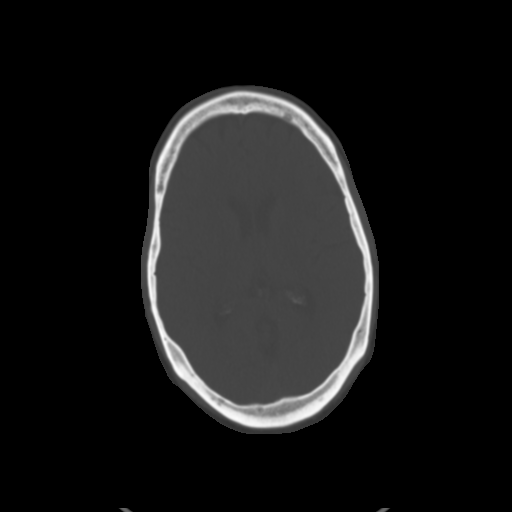
[im 21/31  brain]
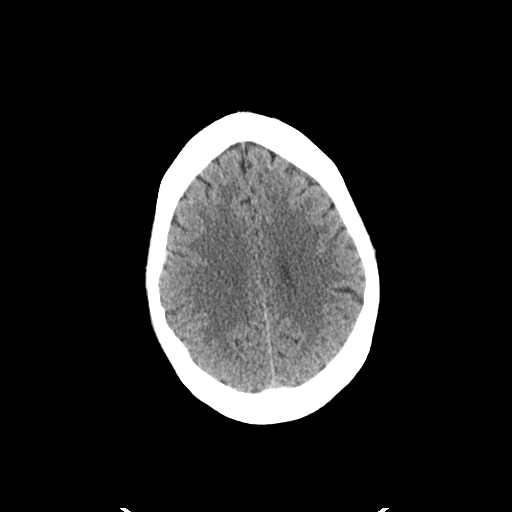
[im 24/31  brain]
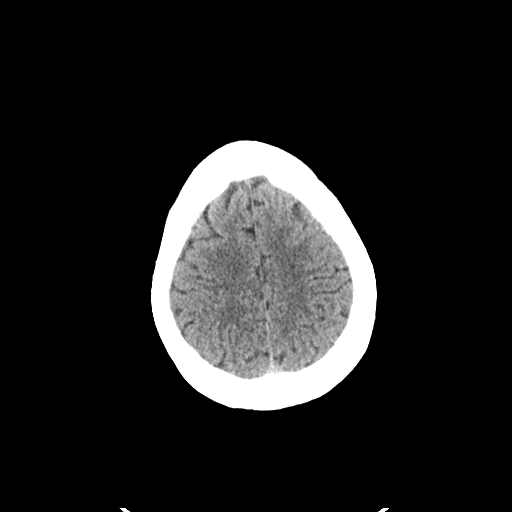
[im 28/31  brain]
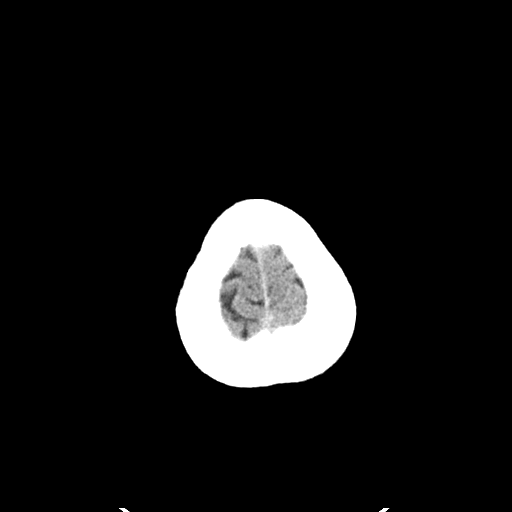

[Series 4: coronal soft tissue · coronal · 0.33mm/px · 3 of 67 slices shown]
[im 23/67  brain]
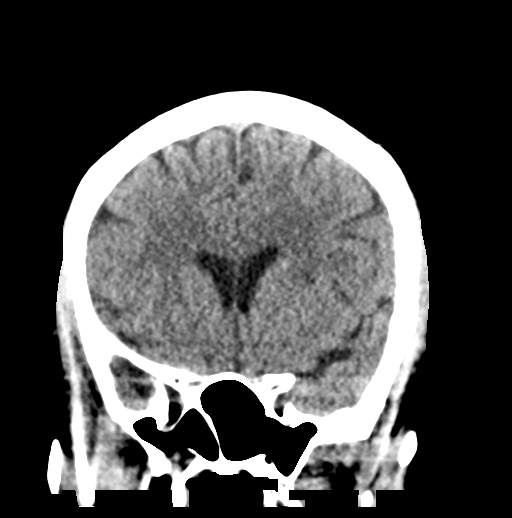
[im 30/67  brain]
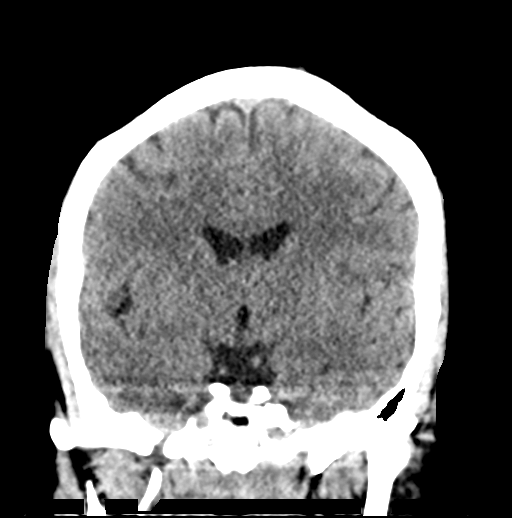
[im 37/67  brain]
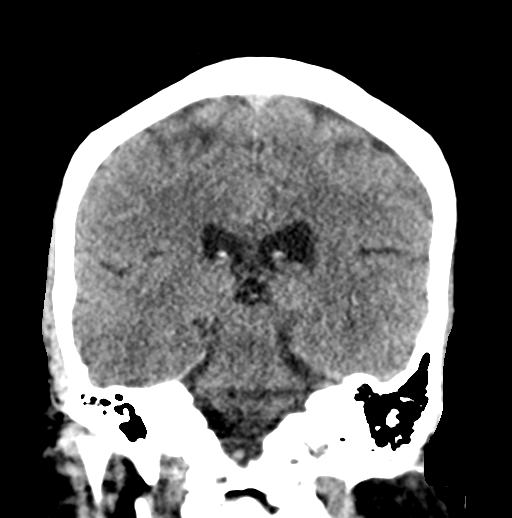

[Series 5: sagittal soft tissue · sagittal · 0.29mm/px · 3 of 67 slices shown]
[im 23/67  brain]
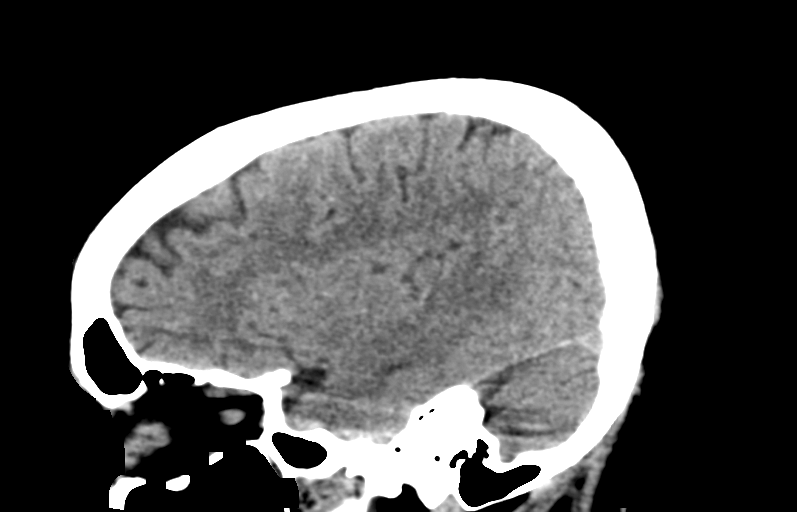
[im 34/67  brain]
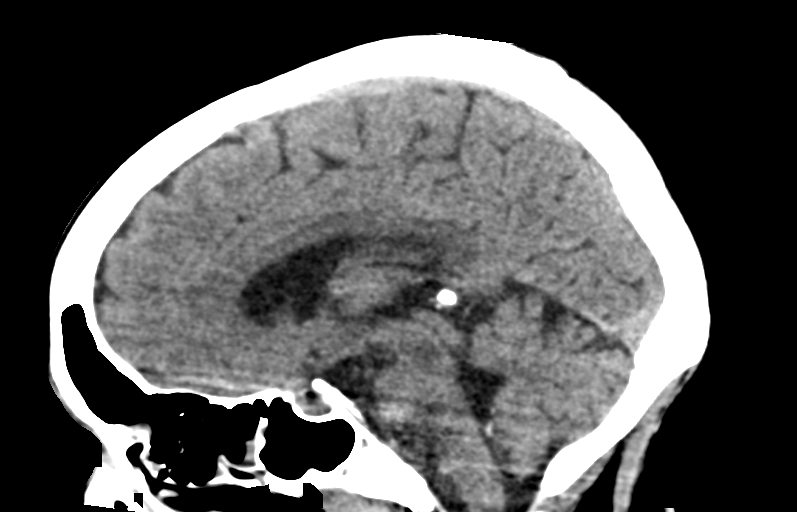
[im 45/67  brain]
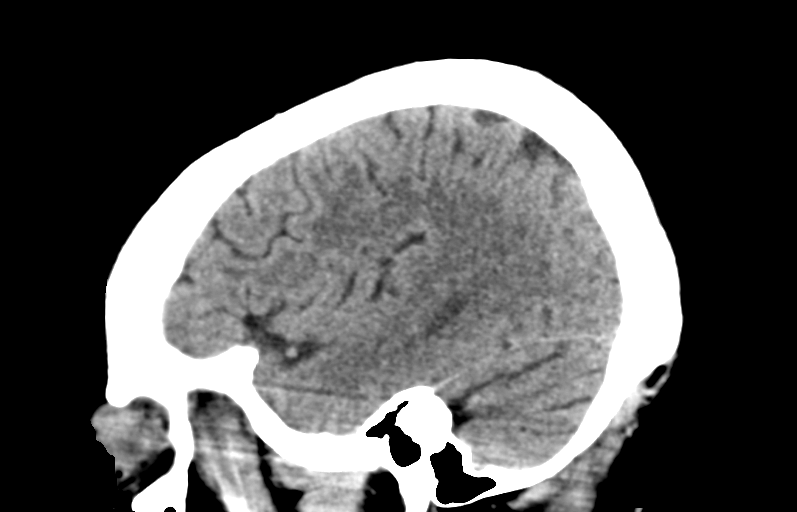

[14 of 47 positions shown; findings below may reference images not displayed]

FINDINGS: Brain:

Cerebral volume is normal for age.

Lacunar infarct within the anterior limb of left internal capsule,
new as compared to the brain MRI of 02/24/2019 but otherwise age
indeterminate.

Small infarct within the right cerebellar hemisphere, also new as
compared to the prior brain MRI but otherwise age-indeterminate
(series 4, image 45) (series 5, image 28).

Known chronic infarct within the right pons.

There is no acute intracranial hemorrhage.

No demarcated cortical infarct.

No extra-axial fluid collection.

No evidence of an intracranial mass.

No midline shift.

Vascular: No hyperdense vessel.  Atherosclerotic calcifications

Skull: Normal. Negative for fracture or focal lesion.

Sinuses/Orbits: Visualized orbits show no acute finding. Trace left
frontal sinus mucosal thickening. Mild mucosal thickening and small
volume fluid scattered within the bilateral ethmoid air cells. Trace
mucosal thickening within the bilateral sphenoid and maxillary
sinuses at the imaged levels.
IMPRESSION: Lacunar infarct within the anterior limb of left internal capsule
and small infarct within the right cerebellar hemisphere, new as
compared to the brain MRI of 02/24/2019 but otherwise
age-indeterminate. Consider a brain MRI for further evaluation.

Redemonstrated chronic infarct within the right pons.

Paranasal sinus disease, as described.

## 2024-06-18 ENCOUNTER — Emergency Department

## 2024-06-18 ENCOUNTER — Other Ambulatory Visit: Payer: Self-pay

## 2024-06-18 ENCOUNTER — Emergency Department
Admission: EM | Admit: 2024-06-18 | Discharge: 2024-06-18 | Disposition: A | Discharge status: Home / Self Care | Attending: Emergency Medicine | Admitting: Emergency Medicine

## 2024-06-18 DIAGNOSIS — I10 Essential (primary) hypertension: Secondary | ICD-10-CM | POA: Diagnosis not present

## 2024-06-18 DIAGNOSIS — Y92410 Unspecified street and highway as the place of occurrence of the external cause: Secondary | ICD-10-CM | POA: Insufficient documentation

## 2024-06-18 DIAGNOSIS — S39012A Strain of muscle, fascia and tendon of lower back, initial encounter: Secondary | ICD-10-CM | POA: Diagnosis not present

## 2024-06-18 DIAGNOSIS — S161XXA Strain of muscle, fascia and tendon at neck level, initial encounter: Secondary | ICD-10-CM | POA: Insufficient documentation

## 2024-06-18 DIAGNOSIS — R519 Headache, unspecified: Secondary | ICD-10-CM | POA: Diagnosis not present

## 2024-06-18 DIAGNOSIS — E119 Type 2 diabetes mellitus without complications: Secondary | ICD-10-CM | POA: Diagnosis not present

## 2024-06-18 DIAGNOSIS — M542 Cervicalgia: Secondary | ICD-10-CM | POA: Diagnosis present

## 2024-06-18 MED ORDER — BACLOFEN 10 MG PO TABS: 10.0000 mg | ORAL_TABLET | Freq: Three times a day (TID) | ORAL | 0 refills | Status: AC
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
## 2024-06-18 NOTE — ED Notes (Signed)
Pt ambulated to room without incident   

## 2024-08-13 ENCOUNTER — Other Ambulatory Visit: Payer: Self-pay | Admitting: Surgery

## 2024-08-13 DIAGNOSIS — S39012A Strain of muscle, fascia and tendon of lower back, initial encounter: Secondary | ICD-10-CM

## 2024-08-19 ENCOUNTER — Inpatient Hospital Stay: Admission: RE | Admit: 2024-08-19 | Discharge: 2024-08-19 | Attending: Surgery | Admitting: Surgery

## 2024-08-19 DIAGNOSIS — S39012A Strain of muscle, fascia and tendon of lower back, initial encounter: Secondary | ICD-10-CM
